# Patient Record
Sex: Female | Born: 2000 | Race: Black or African American | Hispanic: No | Marital: Single | Smoking: Former smoker
Health system: Southern US, Community
[De-identification: ages and names within clinical notes are randomized; demographics above are authoritative.]

## PROBLEM LIST (undated history)

## (undated) ENCOUNTER — Inpatient Hospital Stay (HOSPITAL_COMMUNITY): Payer: Self-pay

## (undated) HISTORY — PX: NO PAST SURGERIES: SHX2092

---
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
## 2021-03-18 ENCOUNTER — Telehealth: Payer: Medicaid Other | Admitting: Physician Assistant

## 2021-06-17 ENCOUNTER — Ambulatory Visit
Admission: EM | Admit: 2021-06-17 | Discharge: 2021-06-17 | Disposition: A | Discharge status: Home / Self Care | Payer: Medicaid Other | Attending: Physician Assistant | Admitting: Physician Assistant

## 2021-06-17 ENCOUNTER — Other Ambulatory Visit: Payer: Self-pay

## 2021-06-17 ENCOUNTER — Encounter: Payer: Self-pay | Admitting: Emergency Medicine

## 2021-06-17 DIAGNOSIS — Z113 Encounter for screening for infections with a predominantly sexual mode of transmission: Secondary | ICD-10-CM | POA: Diagnosis present
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
## 2021-06-17 NOTE — ED Triage Notes (Signed)
Patient c/o vaginal odorous white discharge x 1 month.  Patient did have unprotected sex with her ex after being treated for chlamydia.  Patient feels that her ex did not get treated and she became reinfected.

## 2021-06-18 LAB — HIV ANTIBODY (ROUTINE TESTING W REFLEX): HIV Screen 4th Generation wRfx: NONREACTIVE

## 2021-06-18 LAB — RPR: RPR Ser Ql: NONREACTIVE

## 2021-06-19 ENCOUNTER — Telehealth (HOSPITAL_COMMUNITY): Payer: Self-pay | Admitting: Emergency Medicine

## 2021-06-19 LAB — CERVICOVAGINAL ANCILLARY ONLY
Bacterial Vaginitis (gardnerella): POSITIVE — AB
Candida Glabrata: NEGATIVE
Candida Vaginitis: NEGATIVE
Chlamydia: NEGATIVE
Comment: NEGATIVE
Comment: NEGATIVE
Comment: NEGATIVE
Comment: NEGATIVE
Comment: NEGATIVE
Comment: NORMAL
Neisseria Gonorrhea: NEGATIVE
Trichomonas: NEGATIVE

## 2021-06-19 MED ORDER — METRONIDAZOLE 500 MG PO TABS: 500.0000 mg | ORAL_TABLET | Freq: Two times a day (BID) | ORAL | 0 refills | Status: DC

## 2021-07-17 ENCOUNTER — Other Ambulatory Visit: Payer: Self-pay

## 2021-07-17 ENCOUNTER — Ambulatory Visit (HOSPITAL_COMMUNITY)
Admission: EM | Admit: 2021-07-17 | Discharge: 2021-07-17 | Disposition: A | Discharge status: Home / Self Care | Payer: Medicaid Other | Attending: Nurse Practitioner | Admitting: Nurse Practitioner

## 2021-07-17 ENCOUNTER — Encounter (HOSPITAL_COMMUNITY): Payer: Self-pay | Admitting: Emergency Medicine

## 2021-07-17 DIAGNOSIS — R059 Cough, unspecified: Secondary | ICD-10-CM | POA: Insufficient documentation

## 2021-07-17 DIAGNOSIS — Z20822 Contact with and (suspected) exposure to covid-19: Secondary | ICD-10-CM | POA: Insufficient documentation

## 2021-07-17 DIAGNOSIS — B9689 Other specified bacterial agents as the cause of diseases classified elsewhere: Secondary | ICD-10-CM

## 2021-07-17 DIAGNOSIS — J019 Acute sinusitis, unspecified: Secondary | ICD-10-CM | POA: Insufficient documentation

## 2021-07-17 DIAGNOSIS — H9209 Otalgia, unspecified ear: Secondary | ICD-10-CM | POA: Diagnosis not present

## 2021-07-17 DIAGNOSIS — J111 Influenza due to unidentified influenza virus with other respiratory manifestations: Secondary | ICD-10-CM

## 2021-07-17 LAB — POC INFLUENZA A AND B ANTIGEN (URGENT CARE ONLY)
INFLUENZA A ANTIGEN, POC: NEGATIVE
INFLUENZA B ANTIGEN, POC: NEGATIVE

## 2021-07-17 MED ORDER — AMOXICILLIN-POT CLAVULANATE 875-125 MG PO TABS
1.0000 | ORAL_TABLET | Freq: Two times a day (BID) | ORAL | 0 refills | Status: AC
Start: 2021-07-17 — End: 2021-07-24

## 2021-07-17 NOTE — ED Triage Notes (Signed)
Pt reports sore throat, nasal congestion, chills, body aches, bilateral ear pain and cough x 5 days.  ?

## 2021-07-17 NOTE — Discharge Instructions (Addendum)
-   Flu test today is negative ?- We will notify you if COVID test is positive ?- Please start the Augmentin twice daily for 7 days for the sinus infection.  ?Some things that can make you feel better are: ?- Increased rest ?- Increasing fluid with water/sugar free electrolytes ?- Acetaminophen as needed for fever/pain.  ?- Salt water gargling, chloraseptic spray and throat lozenges ?- OTC guaifenesin (Mucinex).  ?- Saline sinus flushes or a neti pot.  ?- Humidifying the air. ? ?
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
## 2021-07-18 LAB — SARS CORONAVIRUS 2 (TAT 6-24 HRS): SARS Coronavirus 2: NEGATIVE

## 2021-07-28 ENCOUNTER — Ambulatory Visit (INDEPENDENT_AMBULATORY_CARE_PROVIDER_SITE_OTHER): Payer: Medicaid Other | Admitting: Obstetrics and Gynecology

## 2021-07-28 ENCOUNTER — Ambulatory Visit (INDEPENDENT_AMBULATORY_CARE_PROVIDER_SITE_OTHER): Payer: Medicaid Other | Admitting: Licensed Clinical Social Worker

## 2021-07-28 ENCOUNTER — Other Ambulatory Visit (HOSPITAL_COMMUNITY)
Admission: RE | Admit: 2021-07-28 | Discharge: 2021-07-28 | Disposition: A | Discharge status: Home / Self Care | Payer: Medicaid Other | Source: Ambulatory Visit | Attending: Obstetrics and Gynecology | Admitting: Obstetrics and Gynecology

## 2021-07-28 ENCOUNTER — Encounter: Payer: Self-pay | Admitting: Obstetrics and Gynecology

## 2021-07-28 ENCOUNTER — Other Ambulatory Visit: Payer: Self-pay

## 2021-07-28 VITALS — BP 129/83 | HR 98 | Ht 61.0 in | Wt 104.0 lb

## 2021-07-28 DIAGNOSIS — F32A Depression, unspecified: Secondary | ICD-10-CM

## 2021-07-28 DIAGNOSIS — Z01419 Encounter for gynecological examination (general) (routine) without abnormal findings: Secondary | ICD-10-CM | POA: Insufficient documentation

## 2021-07-28 DIAGNOSIS — F330 Major depressive disorder, recurrent, mild: Secondary | ICD-10-CM | POA: Diagnosis not present

## 2021-07-28 DIAGNOSIS — Z30013 Encounter for initial prescription of injectable contraceptive: Secondary | ICD-10-CM

## 2021-07-28 LAB — POCT URINE PREGNANCY: Preg Test, Ur: NEGATIVE

## 2021-07-28 MED ORDER — MEDROXYPROGESTERONE ACETATE 150 MG/ML IM SUSP: 150.0000 mg | INTRAMUSCULAR | 5 refills | Status: DC

## 2021-07-28 MED ORDER — MEDROXYPROGESTERONE ACETATE 150 MG/ML IM SUSP
150.0000 mg | Freq: Once | INTRAMUSCULAR | Status: AC
  Administered 2021-07-28: 150 mg via INTRAMUSCULAR

## 2021-07-28 NOTE — Progress Notes (Signed)
Administrations This Visit   ? ? medroxyPROGESTERone (DEPO-PROVERA) injection 150 mg   ? ? Admin Date ?07/28/2021 Action ?Given Dose ?150 mg Route ?Intramuscular Administered By ?Marya Landry D, CMA  ? ?  ?  ? ?  ?  ?
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
## 2021-07-29 ENCOUNTER — Encounter: Payer: Self-pay | Admitting: *Deleted

## 2021-07-29 LAB — CERVICOVAGINAL ANCILLARY ONLY
Chlamydia: POSITIVE — AB
Comment: NEGATIVE
Comment: NORMAL
Neisseria Gonorrhea: NEGATIVE

## 2021-07-29 MED ORDER — DOXYCYCLINE HYCLATE 100 MG PO CAPS: 100.0000 mg | ORAL_CAPSULE | Freq: Two times a day (BID) | ORAL | 0 refills | Status: DC

## 2021-07-29 NOTE — Progress Notes (Signed)
TC to pt to inform of +CT, RX sent. Advised need for partner to be treated and counseled on abstinence until 1 week after everyone is treated. Advised of recommendation for testing for other STIs and f/u for TOC in 4 weeks. Pt verbalized understanding. Call transferred to schedulers per pt request. MyChart message with education on CT sent with pt's permission.

## 2021-07-29 NOTE — Addendum Note (Signed)
Addended by: Catalina Antigua on: 07/29/2021 02:51 PM ? ? Modules accepted: Orders ? ?

## 2021-07-30 NOTE — BH Specialist Note (Signed)
Integrated Behavioral Health Initial In-Person Visit ? ?MRN: [id number] ?Name: Morgan Haas ? ?Number of Integrated Behavioral Health Clinician visits: 1 ?Session Start time:   2:03pm ?Session End time: 2:48pm ?Total time in minutes: 45 mins in person at [facility]  ? ?Types of Service: Individual psychotherapy ? ?Interpretor:No. Interpretor Name and Language: none ? ? Warm Hand Off Completed. ?  ? ?  ? ? ?Subjective: ?Morgan Haas is a 21 y.o. female accompanied by n/a ?Patient was referred by Dr.Constant for depression . ?Patient reports the following symptoms/concerns: depressed mood, grieving loss of parent, feeling overwhelmed ?Duration of problem: Jan 2022; Severity of problem: mild ? ?Objective: ?Mood: good and Affect: Appropriate ?Risk of harm to self or others: No plan to harm self or others ? ?Life Context: ?Family and Social: Live with family ?School/Work: part time  ?Self-Care: n/a ?Life Changes: Mother passed away ? ?Patient and/or Family's Strengths/Protective Factors: ?Sense of purpose ? ?Goals Addressed: ?Patient will: ?Reduce symptoms of: stress and depression ?Increase knowledge and/or ability of: coping skills and stress reduction  ?Demonstrate ability to: Increase healthy adjustment to current life circumstances and Begin healthy grieving over loss ? ?Progress towards Goals: ?Ongoing ? ?Interventions: ?Interventions utilized: Supportive Counseling and Link to [facility]  ?Standardized Assessments completed: PHQ 9 ? ?Patient and/or Family Response: Morgan Haas responded well to visit.  ? ?Assessment: ?Patient currently experiencing depression episode and grief associate with death of parent. ?  ?Patient may benefit from outpatient therapy. ? ?Plan: ?Follow up with behavioral health clinician on : 08/26/2021 ?Behavioral recommendations: Engage in grief counseling with [facility], engage in therapy, begin journal writing, communicate need with family for added support  ?Referral(s): Aeronautical engineer (LME/Outside Clinic) ?"From scale of 1-10, how likely are you to follow plan?":  ? ?Gwyndolyn Saxon, LCSW ? ? ? ? ? ? ? ? ?

## 2021-08-04 ENCOUNTER — Encounter: Payer: Self-pay | Admitting: *Deleted

## 2021-08-04 ENCOUNTER — Other Ambulatory Visit: Payer: Self-pay | Admitting: Obstetrics and Gynecology

## 2021-08-04 MED ORDER — AZITHROMYCIN 500 MG PO TABS: 1000.0000 mg | ORAL_TABLET | Freq: Once | ORAL | 1 refills | Status: AC

## 2021-08-13 ENCOUNTER — Encounter: Payer: Self-pay | Admitting: Obstetrics and Gynecology

## 2021-08-26 ENCOUNTER — Ambulatory Visit (INDEPENDENT_AMBULATORY_CARE_PROVIDER_SITE_OTHER): Payer: Medicaid Other

## 2021-08-26 ENCOUNTER — Other Ambulatory Visit (HOSPITAL_COMMUNITY)
Admission: RE | Admit: 2021-08-26 | Discharge: 2021-08-26 | Disposition: A | Discharge status: Home / Self Care | Payer: Medicaid Other | Source: Ambulatory Visit | Attending: Obstetrics | Admitting: Obstetrics

## 2021-08-26 ENCOUNTER — Ambulatory Visit (INDEPENDENT_AMBULATORY_CARE_PROVIDER_SITE_OTHER): Payer: Medicaid Other | Admitting: Licensed Clinical Social Worker

## 2021-08-26 DIAGNOSIS — F330 Major depressive disorder, recurrent, mild: Secondary | ICD-10-CM

## 2021-08-26 DIAGNOSIS — Z8619 Personal history of other infectious and parasitic diseases: Secondary | ICD-10-CM | POA: Diagnosis present

## 2021-08-26 NOTE — Progress Notes (Signed)
Patient presents for a TOC for positive Chlamydia on 3/27. Patient denies having any vaginal discharge, odor, or irritation at this time. Patient states that she did completed her treatment and that she has abstained from intercourse since being treated. She does state that she did vomit the first couple of days of the doxycycline, but did complete full prescription. She was then retreated with azithromycin after taking the doxycycline which she states that she tolerated. Also states that she took the refill of the azithromycin that was provided. Patient states that her partner did receive treatment. Patient would like to be rechecked for all STDs including yeast and bv today. Self swab collected. Advised patient that results may take 24-48 hours to return. ?

## 2021-08-27 LAB — CERVICOVAGINAL ANCILLARY ONLY
Bacterial Vaginitis (gardnerella): NEGATIVE
Candida Glabrata: NEGATIVE
Candida Vaginitis: POSITIVE — AB
Chlamydia: NEGATIVE
Comment: NEGATIVE
Comment: NEGATIVE
Comment: NEGATIVE
Comment: NEGATIVE
Comment: NEGATIVE
Comment: NORMAL
Neisseria Gonorrhea: NEGATIVE
Trichomonas: NEGATIVE

## 2021-08-27 NOTE — BH Specialist Note (Signed)
Integrated Behavioral Health Follow Up In-Person Visit ? ?MRN: [id number] ?Name: Morgan Haas ? ?Number of Integrated Behavioral Health Clinician visits: 2- Second Visit ? ?Session Start time: 1041 ?  ?Session End time: 1135 ? ?Total time in minutes: 54 ?In person at [facility]  ? ?Types of Service: Individual psychotherapy ? ?Interpretor:No. Interpretor Name and Language: none  ? ?Subjective: ?Morgan Haas is a 21 y.o. female accompanied by  ?Patient was referred by Dr. Jolayne Panther  for depression. ?Patient reports the following symptoms/concerns: depressed mood, grieving loss of parent  ?Duration of problem: Jan 2022; Severity of problem: mild ? ?Objective: ?Mood: good  and Affect: Appropriate ?Risk of harm to self or others: n/an/aNo plan to harm self or others ? ?Life Context: ?Family and Social: Lives with Morgan Haas in [place]  ?School/Work: [employer] ?Self-Care: n/a ?Life Changes: Mom passed away ? ?Patient and/or Family's Strengths/Protective Factors: ?Concrete supports in place (healthy food, safe environments, etc.) ? ?Goals Addressed: ?Patient will: ? Reduce symptoms of: depression  ? Increase knowledge and/or ability of: coping skills  ? Demonstrate ability to: Begin healthy grieving over loss ? ?Progress towards Goals: ?Other ? ?Interventions: ?Interventions utilized:  Supportive Counseling ?Standardized Assessments completed: Not Needed ? ?Patient and/or Family Response: Ms. Haldeman responded well to visit  ? ?Assessment: ?Patient currently experiencing depression.  ? ?Patient may benefit from community mental health. ? ?Plan: ?Follow up with behavioral health clinician on : prn ?Behavioral recommendations: contact community mental health, grief counseling with [facility], communicate needs with family for addd support  ?Referral(s): Paramedic (LME/Outside Clinic) ?"From scale of 1-10, how likely are you to follow plan?":   ? ?Morgan Saxon, LCSW ? ? ?

## 2021-08-28 ENCOUNTER — Other Ambulatory Visit: Payer: Self-pay | Admitting: Obstetrics and Gynecology

## 2021-08-28 MED ORDER — FLUCONAZOLE 150 MG PO TABS: 150.0000 mg | ORAL_TABLET | Freq: Once | ORAL | 0 refills | Status: AC

## 2021-09-16 ENCOUNTER — Encounter: Payer: Self-pay | Admitting: Obstetrics and Gynecology

## 2021-10-20 ENCOUNTER — Ambulatory Visit: Payer: Medicaid Other

## 2021-10-21 ENCOUNTER — Ambulatory Visit: Payer: Medicaid Other

## 2021-10-28 ENCOUNTER — Ambulatory Visit: Payer: Medicaid Other

## 2021-10-30 ENCOUNTER — Ambulatory Visit (INDEPENDENT_AMBULATORY_CARE_PROVIDER_SITE_OTHER): Payer: Medicaid Other

## 2021-10-30 VITALS — BP 109/68 | HR 68 | Wt 101.0 lb

## 2021-10-30 DIAGNOSIS — Z3042 Encounter for surveillance of injectable contraceptive: Secondary | ICD-10-CM

## 2021-10-30 MED ORDER — MEDROXYPROGESTERONE ACETATE 150 MG/ML IM SUSP
150.0000 mg | Freq: Once | INTRAMUSCULAR | Status: AC
  Administered 2021-10-30: 150 mg via INTRAMUSCULAR

## 2021-10-30 NOTE — Progress Notes (Signed)
SUBJECTIVE: Morgan Haas is a 21 y.o. female who presents for DEPO Injection.   OBJECTIVE: Appears well, in no apparent distress.  Vital signs are normal..    ASSESSMENT: Need for BC.   PLAN: DEPO Injection given in [facility], tolerated well. Next DEPO due Sept. 14-28, 2023   Administrations This Visit     medroxyPROGESTERone (DEPO-PROVERA) injection 150 mg     Admin Date 10/30/2021 Action Given Dose 150 mg Route Intramuscular Administered By Maretta Bees, RMA

## 2021-10-30 NOTE — Progress Notes (Signed)
Agree with nurses's documentation of this patient's clinic encounter.  Celes Dedic L, MD  

## 2022-01-21 ENCOUNTER — Ambulatory Visit: Payer: Medicaid Other

## 2022-01-21 NOTE — Progress Notes (Deleted)
Date last pap: ***.  Last Depo-Provera: ***.  Side Effects if any: ***.  Serum HCG indicated? ***.  Depo-Provera 150 mg IM given by: ***.  Next appointment due ***.

## 2022-02-26 ENCOUNTER — Ambulatory Visit
Admission: RE | Admit: 2022-02-26 | Discharge: 2022-02-26 | Disposition: A | Discharge status: Home / Self Care | Payer: Medicaid Other | Source: Ambulatory Visit | Attending: Internal Medicine | Admitting: Internal Medicine

## 2022-02-26 ENCOUNTER — Ambulatory Visit (INDEPENDENT_AMBULATORY_CARE_PROVIDER_SITE_OTHER): Payer: Medicaid Other

## 2022-02-26 VITALS — BP 108/71 | HR 62 | Temp 97.9°F | Resp 18

## 2022-02-26 DIAGNOSIS — R0602 Shortness of breath: Secondary | ICD-10-CM | POA: Insufficient documentation

## 2022-02-26 DIAGNOSIS — Z113 Encounter for screening for infections with a predominantly sexual mode of transmission: Secondary | ICD-10-CM | POA: Diagnosis present

## 2022-02-26 LAB — POCT URINE PREGNANCY: Preg Test, Ur: NEGATIVE
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
## 2022-02-26 NOTE — Discharge Instructions (Addendum)
Chest x-ray was normal.  Recommend that you stop smoking cigarettes and vaping to see if symptoms go away.  Also recommend that you follow-up with your primary care doctor and pulmonologist at provided contact information for further evaluation and management.  Please go to the emergency department if symptoms persist or worsen.  Your STD testing is pending.  We will call if it is abnormal and treat as appropriate.  Please refrain from sexual activity until test results and treatment are complete.
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
## 2022-02-27 ENCOUNTER — Telehealth (HOSPITAL_COMMUNITY): Payer: Self-pay | Admitting: Emergency Medicine

## 2022-02-27 LAB — CERVICOVAGINAL ANCILLARY ONLY
Bacterial Vaginitis (gardnerella): POSITIVE — AB
Candida Glabrata: NEGATIVE
Candida Vaginitis: NEGATIVE
Chlamydia: NEGATIVE
Comment: NEGATIVE
Comment: NEGATIVE
Comment: NEGATIVE
Comment: NEGATIVE
Comment: NEGATIVE
Comment: NORMAL
Neisseria Gonorrhea: NEGATIVE
Trichomonas: NEGATIVE

## 2022-02-27 LAB — HIV ANTIBODY (ROUTINE TESTING W REFLEX): HIV Screen 4th Generation wRfx: NONREACTIVE

## 2022-02-27 LAB — RPR: RPR Ser Ql: NONREACTIVE

## 2022-02-27 MED ORDER — METRONIDAZOLE 500 MG PO TABS: 500.0000 mg | ORAL_TABLET | Freq: Two times a day (BID) | ORAL | 0 refills | Status: DC
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
## 2022-08-04 ENCOUNTER — Ambulatory Visit: Payer: Medicaid Other | Admitting: Obstetrics

## 2022-10-24 ENCOUNTER — Ambulatory Visit (HOSPITAL_COMMUNITY)
Admission: RE | Admit: 2022-10-24 | Discharge: 2022-10-24 | Disposition: A | Discharge status: Home / Self Care | Payer: Medicaid Other | Source: Ambulatory Visit | Attending: Emergency Medicine | Admitting: Emergency Medicine

## 2022-10-24 ENCOUNTER — Encounter (HOSPITAL_COMMUNITY): Payer: Self-pay

## 2022-10-24 ENCOUNTER — Other Ambulatory Visit: Payer: Self-pay

## 2022-10-24 VITALS — BP 121/87 | HR 94 | Temp 98.6°F | Resp 18

## 2022-10-24 DIAGNOSIS — J069 Acute upper respiratory infection, unspecified: Secondary | ICD-10-CM | POA: Diagnosis not present

## 2022-10-24 MED ORDER — GUAIFENESIN ER 600 MG PO TB12: 1200.0000 mg | ORAL_TABLET | Freq: Two times a day (BID) | ORAL | 0 refills | Status: AC

## 2022-10-24 MED ORDER — CETIRIZINE HCL 10 MG PO TABS: 10.0000 mg | ORAL_TABLET | Freq: Every day | ORAL | 0 refills | Status: DC | PRN

## 2022-10-24 MED ORDER — FLUTICASONE PROPIONATE 50 MCG/ACT NA SUSP: 1.0000 | Freq: Every day | NASAL | 2 refills | Status: DC
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
## 2022-10-24 NOTE — ED Triage Notes (Signed)
Pt reports being congested with runny nose .

## 2022-10-24 NOTE — Discharge Instructions (Addendum)
Your symptoms appear viral in nature.  Please take your daily antihistamines, cetirizine.  Start the [employer] and Mucinex.  Ensure you are drinking at least 64 ounces of water daily.  Please also do the nasal rinses twice daily with filtered water.  You can sleep with a humidifier, drink tea with honey, and do warm saline gargles for your sore throat.  Please return to clinic in 5 to 7 days, if your symptoms have not improved for reevaluation.  You can also follow-up with your primary care provider.

## 2022-12-09 ENCOUNTER — Ambulatory Visit: Payer: Medicaid Other | Admitting: Orthopaedic Surgery

## 2022-12-10 ENCOUNTER — Encounter: Payer: Self-pay | Admitting: Physician Assistant

## 2022-12-10 ENCOUNTER — Ambulatory Visit
Admission: EM | Admit: 2022-12-10 | Discharge: 2022-12-10 | Disposition: A | Discharge status: Home / Self Care | Payer: Medicaid Other | Attending: Physician Assistant | Admitting: Physician Assistant

## 2022-12-10 ENCOUNTER — Ambulatory Visit: Payer: Medicaid Other

## 2022-12-10 DIAGNOSIS — Z1152 Encounter for screening for COVID-19: Secondary | ICD-10-CM | POA: Insufficient documentation

## 2022-12-10 DIAGNOSIS — J069 Acute upper respiratory infection, unspecified: Secondary | ICD-10-CM | POA: Diagnosis not present

## 2022-12-10 LAB — POCT RAPID STREP A (OFFICE): Rapid Strep A Screen: NEGATIVE
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
## 2022-12-10 NOTE — ED Triage Notes (Signed)
Pt reports that she had sore throat, body aches, chills for about 4 days. Hasn't taken any medications for symptoms. Pt reports has swollen lymph node on right side of neck.

## 2022-12-25 ENCOUNTER — Encounter: Payer: Medicaid Other | Admitting: Family

## 2022-12-25 NOTE — Progress Notes (Signed)
Erroneous encounter-disregard

## 2023-02-22 ENCOUNTER — Other Ambulatory Visit (HOSPITAL_COMMUNITY)
Admission: RE | Admit: 2023-02-22 | Discharge: 2023-02-22 | Disposition: A | Discharge status: Home / Self Care | Payer: Medicaid Other | Source: Ambulatory Visit | Attending: Obstetrics and Gynecology | Admitting: Obstetrics and Gynecology

## 2023-02-22 ENCOUNTER — Encounter: Payer: Self-pay | Admitting: Obstetrics and Gynecology

## 2023-02-22 ENCOUNTER — Ambulatory Visit (INDEPENDENT_AMBULATORY_CARE_PROVIDER_SITE_OTHER): Payer: Medicaid Other | Admitting: Obstetrics and Gynecology

## 2023-02-22 VITALS — BP 95/57 | HR 68 | Ht 61.0 in | Wt 102.0 lb

## 2023-02-22 DIAGNOSIS — Z01419 Encounter for gynecological examination (general) (routine) without abnormal findings: Secondary | ICD-10-CM | POA: Insufficient documentation

## 2023-02-22 DIAGNOSIS — Z1339 Encounter for screening examination for other mental health and behavioral disorders: Secondary | ICD-10-CM | POA: Diagnosis not present

## 2023-02-22 DIAGNOSIS — Z3202 Encounter for pregnancy test, result negative: Secondary | ICD-10-CM | POA: Diagnosis not present

## 2023-02-22 LAB — POCT URINE PREGNANCY: Preg Test, Ur: NEGATIVE

## 2023-02-22 MED ORDER — NORETHIN ACE-ETH ESTRAD-FE 1-20 MG-MCG(24) PO TABS
1.0000 | ORAL_TABLET | Freq: Every day | ORAL | 11 refills | Status: DC
Start: 2023-02-22 — End: 2023-09-20

## 2023-02-22 MED ORDER — IMIQUIMOD 5 % EX CREA: TOPICAL_CREAM | CUTANEOUS | 3 refills | Status: DC
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
## 2023-02-23 LAB — HIV ANTIBODY (ROUTINE TESTING W REFLEX): HIV Screen 4th Generation wRfx: NONREACTIVE

## 2023-02-23 LAB — HEPATITIS B SURFACE ANTIGEN: Hepatitis B Surface Ag: NEGATIVE

## 2023-02-23 LAB — HEPATITIS C ANTIBODY: Hep C Virus Ab: NONREACTIVE

## 2023-02-23 LAB — RPR: RPR Ser Ql: NONREACTIVE

## 2023-02-24 LAB — CERVICOVAGINAL ANCILLARY ONLY
Chlamydia: NEGATIVE
Comment: NEGATIVE
Comment: NORMAL
Neisseria Gonorrhea: NEGATIVE

## 2023-03-01 LAB — CYTOLOGY - PAP
Comment: NEGATIVE
High risk HPV: POSITIVE — AB

## 2023-03-24 ENCOUNTER — Ambulatory Visit: Payer: Medicaid Other

## 2023-03-25 ENCOUNTER — Ambulatory Visit: Payer: Medicaid Other

## 2023-04-06 ENCOUNTER — Ambulatory Visit: Payer: Medicaid Other

## 2023-04-26 ENCOUNTER — Ambulatory Visit: Payer: Medicaid Other | Admitting: Dermatology

## 2023-07-08 ENCOUNTER — Ambulatory Visit

## 2023-07-08 VITALS — BP 115/64 | HR 67

## 2023-07-08 DIAGNOSIS — Z3201 Encounter for pregnancy test, result positive: Secondary | ICD-10-CM | POA: Diagnosis not present

## 2023-07-08 DIAGNOSIS — Z3200 Encounter for pregnancy test, result unknown: Secondary | ICD-10-CM

## 2023-07-08 LAB — POCT URINE PREGNANCY: Preg Test, Ur: POSITIVE — AB

## 2023-07-08 MED ORDER — VITAFOL GUMMIES 3.33-0.333-34.8 MG PO CHEW: 1.0000 | CHEWABLE_TABLET | Freq: Every day | ORAL | 5 refills | Status: DC

## 2023-07-08 MED ORDER — PROMETHAZINE HCL 25 MG PO TABS: 25.0000 mg | ORAL_TABLET | Freq: Four times a day (QID) | ORAL | 1 refills | Status: DC | PRN

## 2023-07-08 NOTE — Progress Notes (Signed)
 Morgan Haas here for a UPT. Pt had a positive upt at home. LMP is 05/12/23.     UPT in office Positive.    Reviewed medications and informed to start a PNV, if not already. Pt to follow up in 1 week for New OB visit.

## 2023-07-29 ENCOUNTER — Ambulatory Visit: Admitting: *Deleted

## 2023-07-29 ENCOUNTER — Other Ambulatory Visit (INDEPENDENT_AMBULATORY_CARE_PROVIDER_SITE_OTHER): Payer: Self-pay

## 2023-07-29 ENCOUNTER — Other Ambulatory Visit (HOSPITAL_COMMUNITY)
Admission: RE | Admit: 2023-07-29 | Discharge: 2023-07-29 | Disposition: A | Discharge status: Home / Self Care | Source: Ambulatory Visit | Attending: Obstetrics and Gynecology | Admitting: Obstetrics and Gynecology

## 2023-07-29 VITALS — BP 110/68 | HR 63 | Wt 101.6 lb

## 2023-07-29 DIAGNOSIS — Z3480 Encounter for supervision of other normal pregnancy, unspecified trimester: Secondary | ICD-10-CM

## 2023-07-29 DIAGNOSIS — Z3401 Encounter for supervision of normal first pregnancy, first trimester: Secondary | ICD-10-CM

## 2023-07-29 DIAGNOSIS — Z3A11 11 weeks gestation of pregnancy: Secondary | ICD-10-CM | POA: Diagnosis not present

## 2023-07-29 DIAGNOSIS — Z1339 Encounter for screening examination for other mental health and behavioral disorders: Secondary | ICD-10-CM

## 2023-07-29 DIAGNOSIS — J302 Other seasonal allergic rhinitis: Secondary | ICD-10-CM | POA: Diagnosis not present

## 2023-07-29 DIAGNOSIS — O3680X0 Pregnancy with inconclusive fetal viability, not applicable or unspecified: Secondary | ICD-10-CM

## 2023-07-29 MED ORDER — CETIRIZINE HCL 10 MG PO TABS: 10.0000 mg | ORAL_TABLET | Freq: Every day | ORAL | 0 refills | Status: AC | PRN

## 2023-07-29 MED ORDER — COMPLETENATE 29-1 MG PO CHEW: 1.0000 | CHEWABLE_TABLET | Freq: Every day | ORAL | 11 refills | Status: DC
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
## 2023-07-30 NOTE — Patient Instructions (Addendum)
 The [facility] [employer] has a partnership with the [facility] [facility] to provide prenatal navigation for the most needed resources in our community. In order to see how we can help connect you to these resources we need consent to contact you. Please complete the very short consent using the link below:   English Link: [email or web address].[email or web address]?[email or web address]  Spanish Link: [email or web address].[email or web address]?[email or web address]  Our practice his participating in a study that provides no-cost doula care. ACURE4Moms is a study looking at how doula care can reduce birthing disparities for Black and brown birthing people. We like to refer patients as soon as possible, but definitely before 28 weeks so patients can get to know their doula.    A doula is trained to provide support before, during and just after you give birth. While doulas do not provide medical care, they do provide emotional, physical and educational support. Doulas can help reduce your stress and comfort you and your partner. They can help you cope with labor by helping you use breathing techniques, massage, creative labor positioning, essential oils and affirmations.   ACURE4Moms is a research study trying to reduce:   low birthweight babies  emergency department visits & hospitalizations for birthing persons and their babies  depression among birthing people  discrimination in pregnancy-related care ACURE4Moms is trying out 2 programs designed by  people who have given birth. These programs include: 1. Sharing patient data and warning alerts with clinic staff to keep them accountable for their patients' outcomes and providing tools to help them  reduce bias in care. 2. Matching eligible patients with doulas from the  same community as the patients.  If you would like to participate in this study, please visit:   http://carroll-castaneda.info/  Options for Doula Care in the [place]  As you review  your birthing options, consider having a birth doula. A doula is trained to provide support before, during and just after you give birth. There are also postpartum doulas that help you adjust to new parenthood.  While doulas do not provide medical care, they do provide emotional, physical and educational support. A few months before your baby arrives, doulas can help answer questions, ease concerns and help you create and support your birthing plan.    Doulas can help reduce your stress and comfort you and your partner. They can help you cope with labor by helping you use breathing techniques, massage, creative labor positioning, essential oils and affirmations.   Studies show that the benefits of having a doula include:   A more positive birth experience  Fewer requests for pain-relief medication  Less likelihood of cesarean section, commonly called a c-section   Doulas are typically hired via a Advertising account planner between you and the doula. We are happy to provide a list of the most active doulas in the area, all of whom are credentialed by [facility] and will not count as a visitor at your birth.  There are several options for no-cost doula care at our hospital, including:  [facility] Volunteer Doula Program Every [employer] Program A Cure 4 Moms Doula Study (available only at [employer] [facility], [place], [place] and [facility] offices)  For more information on these programs or to receive a list of doulas active in our area, please email [email or web address]   [facility] [place] Phone [phone]  Address [address] [place], [place] [address]  Hours Open 24/7. No appointment required.   [facility] Office [phone]  [place], [place] [address] Phone [phone]   [facility] [employer]: go to website to see calendar  [employer]: located inside the Therapist, music [facility] [employer]. You can go after your first visit with the doctor.

## 2023-08-01 LAB — CULTURE, OB URINE

## 2023-08-01 LAB — URINE CULTURE, OB REFLEX: Organism ID, Bacteria: NO GROWTH

## 2023-08-02 ENCOUNTER — Other Ambulatory Visit: Payer: Self-pay

## 2023-08-02 ENCOUNTER — Other Ambulatory Visit

## 2023-08-02 LAB — CERVICOVAGINAL ANCILLARY ONLY
Bacterial Vaginitis (gardnerella): POSITIVE — AB
Candida Glabrata: NEGATIVE
Candida Vaginitis: NEGATIVE
Chlamydia: NEGATIVE
Comment: NEGATIVE
Comment: NEGATIVE
Comment: NEGATIVE
Comment: NEGATIVE
Comment: NEGATIVE
Comment: NORMAL
Neisseria Gonorrhea: NEGATIVE
Trichomonas: NEGATIVE

## 2023-08-02 MED ORDER — METRONIDAZOLE 500 MG PO TABS: 500.0000 mg | ORAL_TABLET | Freq: Two times a day (BID) | ORAL | 0 refills | Status: DC

## 2023-08-02 NOTE — Progress Notes (Signed)
Flagyl rx sent for BV

## 2023-08-17 ENCOUNTER — Encounter: Admitting: Advanced Practice Midwife

## 2023-08-19 ENCOUNTER — Telehealth: Payer: Self-pay | Admitting: Clinical

## 2023-08-19 NOTE — Telephone Encounter (Signed)
 Attempt call regarding referral; Unable to leave message.

## 2023-09-17 ENCOUNTER — Ambulatory Visit

## 2023-09-20 ENCOUNTER — Inpatient Hospital Stay (HOSPITAL_COMMUNITY)
Admission: EM | Admit: 2023-09-20 | Discharge: 2023-09-20 | Disposition: A | Discharge status: Home / Self Care | Attending: Obstetrics and Gynecology | Admitting: Obstetrics and Gynecology

## 2023-09-20 ENCOUNTER — Encounter (HOSPITAL_COMMUNITY): Payer: Self-pay | Admitting: Emergency Medicine

## 2023-09-20 ENCOUNTER — Other Ambulatory Visit: Payer: Self-pay

## 2023-09-20 DIAGNOSIS — Z3A18 18 weeks gestation of pregnancy: Secondary | ICD-10-CM | POA: Diagnosis not present

## 2023-09-20 DIAGNOSIS — O26892 Other specified pregnancy related conditions, second trimester: Secondary | ICD-10-CM | POA: Diagnosis present

## 2023-09-20 DIAGNOSIS — M545 Low back pain, unspecified: Secondary | ICD-10-CM | POA: Insufficient documentation

## 2023-09-20 DIAGNOSIS — M62830 Muscle spasm of back: Secondary | ICD-10-CM | POA: Insufficient documentation

## 2023-09-20 DIAGNOSIS — M791 Myalgia, unspecified site: Secondary | ICD-10-CM
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
## 2023-09-21 LAB — ABO/RH: ABO/RH(D): A POS

## 2023-09-22 ENCOUNTER — Other Ambulatory Visit: Payer: Self-pay | Admitting: Obstetrics and Gynecology

## 2023-09-22 ENCOUNTER — Other Ambulatory Visit

## 2023-09-24 ENCOUNTER — Telehealth: Payer: Self-pay

## 2023-09-24 ENCOUNTER — Other Ambulatory Visit

## 2023-09-28 ENCOUNTER — Other Ambulatory Visit: Payer: Self-pay

## 2023-09-28 DIAGNOSIS — Z01419 Encounter for gynecological examination (general) (routine) without abnormal findings: Secondary | ICD-10-CM

## 2023-09-28 NOTE — Telephone Encounter (Signed)
 Call returned to patient. RN advised that aldara  cream is not recommended to use during pregnancy. Pt was not pregnant at the time that medication was initially prescribed. Pt states that she has been using cream. RN advised that a message would be sent to see if there are any other alternatives for her.

## 2023-10-04 ENCOUNTER — Encounter: Payer: Self-pay | Admitting: Advanced Practice Midwife

## 2023-10-04 ENCOUNTER — Ambulatory Visit: Admitting: Advanced Practice Midwife

## 2023-10-04 VITALS — BP 112/55 | HR 91 | Wt 111.4 lb

## 2023-10-04 DIAGNOSIS — Z3482 Encounter for supervision of other normal pregnancy, second trimester: Secondary | ICD-10-CM

## 2023-10-04 DIAGNOSIS — O98312 Other infections with a predominantly sexual mode of transmission complicating pregnancy, second trimester: Secondary | ICD-10-CM

## 2023-10-04 DIAGNOSIS — A630 Anogenital (venereal) warts: Secondary | ICD-10-CM

## 2023-10-04 DIAGNOSIS — Z31430 Encounter of female for testing for genetic disease carrier status for procreative management: Secondary | ICD-10-CM

## 2023-10-04 DIAGNOSIS — Z3A20 20 weeks gestation of pregnancy: Secondary | ICD-10-CM | POA: Diagnosis not present

## 2023-10-04 DIAGNOSIS — Z3480 Encounter for supervision of other normal pregnancy, unspecified trimester: Secondary | ICD-10-CM

## 2023-10-04 DIAGNOSIS — Z3402 Encounter for supervision of normal first pregnancy, second trimester: Secondary | ICD-10-CM

## 2023-10-04 DIAGNOSIS — R87612 Low grade squamous intraepithelial lesion on cytologic smear of cervix (LGSIL): Secondary | ICD-10-CM | POA: Insufficient documentation
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
## 2023-10-05 LAB — CBC/D/PLT+RPR+RH+ABO+RUBIGG...
Antibody Screen: NEGATIVE
Basophils Absolute: 0 10*3/uL (ref 0.0–0.2)
Basos: 0 %
EOS (ABSOLUTE): 0 10*3/uL (ref 0.0–0.4)
Eos: 1 %
HCV Ab: NONREACTIVE
HIV Screen 4th Generation wRfx: NONREACTIVE
Hematocrit: 33.5 % — ABNORMAL LOW (ref 34.0–46.6)
Hemoglobin: 11.1 g/dL (ref 11.1–15.9)
Hepatitis B Surface Ag: NEGATIVE
Immature Grans (Abs): 0 10*3/uL (ref 0.0–0.1)
Immature Granulocytes: 0 %
Lymphocytes Absolute: 1.8 10*3/uL (ref 0.7–3.1)
Lymphs: 22 %
MCH: 32.4 pg (ref 26.6–33.0)
MCHC: 33.1 g/dL (ref 31.5–35.7)
MCV: 98 fL — ABNORMAL HIGH (ref 79–97)
Monocytes Absolute: 0.8 10*3/uL (ref 0.1–0.9)
Monocytes: 9 %
Neutrophils Absolute: 5.8 10*3/uL (ref 1.4–7.0)
Neutrophils: 68 %
Platelets: 296 10*3/uL (ref 150–450)
RBC: 3.43 x10E6/uL — ABNORMAL LOW (ref 3.77–5.28)
RDW: 12.3 % (ref 11.7–15.4)
RPR Ser Ql: NONREACTIVE
Rh Factor: POSITIVE
Rubella Antibodies, IGG: 2.23 {index} (ref >0.99)
WBC: 8.5 10*3/uL (ref 3.4–10.8)

## 2023-10-05 LAB — HCV INTERPRETATION

## 2023-10-06 LAB — AFP, SERUM, OPEN SPINA BIFIDA
AFP MoM: 1.25
AFP Value: 95.9 ng/mL
Gest. Age on Collection Date: 20 wk
Maternal Age At EDD: 22.4 a
OSBR Risk 1 IN: 10000
Test Results:: NEGATIVE
Weight: 111 [lb_av]

## 2023-10-10 LAB — PANORAMA PRENATAL TEST FULL PANEL:PANORAMA TEST PLUS 5 ADDITIONAL MICRODELETIONS: FETAL FRACTION: 7.2

## 2023-10-11 ENCOUNTER — Telehealth: Payer: Self-pay

## 2023-10-11 LAB — HORIZON CUSTOM: REPORT SUMMARY: NEGATIVE

## 2023-10-19 ENCOUNTER — Encounter: Admitting: Advanced Practice Midwife

## 2023-10-20 ENCOUNTER — Ambulatory Visit: Payer: Self-pay | Admitting: Licensed Clinical Social Worker

## 2023-10-20 NOTE — BH Specialist Note (Unsigned)
 Integrated Behavioral Health via Telemedicine Visit  10/20/2023 Morgan Haas [id number]  Number of Integrated Behavioral Health Clinician visits: No data recorded Session Start time: No data recorded  Session End time: No data recorded Total time in minutes: No data recorded   Referring Provider: *** Patient/Family location: *** [facility] Provider location: *** All persons participating in visit: *** Types of Service: {CHL AMB TYPE OF [phone]}  I connected with Morgan Haas and/or Morgan Haas's {family members:20773} via  Telephone or Engineer, civil (consulting)  (Video is Surveyor, mining) and verified that I am speaking with the correct person using two identifiers. Discussed confidentiality: {YES/NO:21197}  I discussed the limitations of telemedicine and the availability of in person appointments.  Discussed there is a possibility of technology failure and discussed alternative modes of communication if that failure occurs.  I discussed that engaging in this telemedicine visit, they consent to the provision of behavioral healthcare and the services will be billed under their insurance.  Patient and/or legal guardian expressed understanding and consented to Telemedicine visit: {YES/NO:21197}  Presenting Concerns: Patient and/or family reports the following symptoms/concerns: *** Duration of problem: ***; Severity of problem: {Mild/Moderate/Severe:20260}  Patient and/or Family's Strengths/Protective Factors: {CHL AMB BH PROTECTIVE FACTORS:[phone]}  Goals Addressed: Patient will:  Reduce symptoms of: {IBH Symptoms:21014056}   Increase knowledge and/or ability of: {IBH Patient Tools:21014057}   Demonstrate ability to: {IBH Goals:21014053}  Progress towards Goals: {CHL AMB BH PROGRESS TOWARDS [phone]}    Interventions: Interventions utilized:  {IBH Interventions:21014054} Standardized Assessments completed: {IBH Screening  Tools:21014051}    Patient and/or Family Response: ***  Clinical Assessment/Diagnosis  No diagnosis found.    Assessment: Patient currently experiencing ***.   Patient may benefit from ***.  Plan: Follow up with behavioral health clinician on : *** Behavioral recommendations: *** Referral(s): {IBH Referrals:21014055}  I discussed the assessment and treatment plan with the patient and/or parent/guardian. They were provided an opportunity to ask questions and all were answered. They agreed with the plan and demonstrated an understanding of the instructions.   They were advised to call back or seek an in-person evaluation if the symptoms worsen or if the condition fails to improve as anticipated.  Morgan Haas, LCSWA

## 2023-10-25 ENCOUNTER — Ambulatory Visit: Payer: Self-pay | Admitting: Licensed Clinical Social Worker

## 2023-10-25 DIAGNOSIS — F4322 Adjustment disorder with anxiety: Secondary | ICD-10-CM

## 2023-10-25 NOTE — BH Specialist Note (Unsigned)
 Integrated Behavioral Health via Telemedicine Visit  10/25/2023 Morgan Haas [id number]  Number of Integrated Behavioral Health Clinician visits: No data recorded Session Start time: No data recorded   Session End time: No data recorded Total time in minutes: No data recorded   Referring Provider: *** Patient/Family location: *** [facility] Provider location: *** All persons participating in visit: *** Types of Service: {CHL AMB TYPE OF [phone]}  I connected with [name] and/or Inocente Germani's {family members:20773} via  Telephone or Engineer, civil (consulting)  (Video is Surveyor, mining) and verified that I am speaking with the correct person using two identifiers. Discussed confidentiality: {YES/NO:21197}  I discussed the limitations of telemedicine and the availability of in person appointments.  Discussed there is a possibility of technology failure and discussed alternative modes of communication if that failure occurs.  I discussed that engaging in this telemedicine visit, they consent to the provision of behavioral healthcare and the services will be billed under their insurance.  Patient and/or legal guardian expressed understanding and consented to Telemedicine visit: {YES/NO:21197}  Presenting Concerns: Patient and/or family reports the following symptoms/concerns: *** Duration of problem: ***; Severity of problem: {Mild/Moderate/Severe:20260}  Patient and/or Family's Strengths/Protective Factors: {CHL AMB BH PROTECTIVE FACTORS:[phone]}  Goals Addressed: Patient will:  Reduce symptoms of: {IBH Symptoms:21014056}   Increase knowledge and/or ability of: {IBH Patient Tools:21014057}   Demonstrate ability to: {IBH Goals:21014053}  Progress towards Goals: {CHL AMB BH PROGRESS TOWARDS GOALS:[phone]}    Interventions: Interventions utilized:  {IBH Interventions:21014054} Standardized Assessments completed: {IBH Screening  Tools:21014051}    Patient and/or Family Response: Patient was present for today's virtual appointment. She reports not sure why there was a BH referral. She's currently pregnant and she's in school, majoring in psychology. She's having a boy and she's really excited about her pregnancy.   [facility] provided patient with a self referral form for doula services. Concerned about labor and delivery and what to expect.   She reports her only stressor at this time getting a job.   Mom passed away in November 06, 2020 from Cancer.  Injuried in the [employer] in 11-06-20  [employer] [facility]	[place] for 2 months   Clinical Assessment/Diagnosis  No diagnosis found.    Assessment: Patient currently experiencing ***.   Patient may benefit from ***.  Plan: Follow up with behavioral health clinician on : *** Behavioral recommendations: *** Referral(s): {IBH Referrals:21014055}  I discussed the assessment and treatment plan with the patient and/or parent/guardian. They were provided an opportunity to ask questions and all were answered. They agreed with the plan and demonstrated an understanding of the instructions.   They were advised to call back or seek an in-person evaluation if the symptoms worsen or if the condition fails to improve as anticipated.  Chrles Selley LITTIE Seats, LCSWA

## 2023-10-29 ENCOUNTER — Ambulatory Visit

## 2023-10-29 ENCOUNTER — Other Ambulatory Visit

## 2023-10-29 DIAGNOSIS — Z3480 Encounter for supervision of other normal pregnancy, unspecified trimester: Secondary | ICD-10-CM

## 2023-11-01 ENCOUNTER — Ambulatory Visit (INDEPENDENT_AMBULATORY_CARE_PROVIDER_SITE_OTHER): Admitting: Obstetrics and Gynecology

## 2023-11-01 ENCOUNTER — Other Ambulatory Visit (HOSPITAL_COMMUNITY)
Admission: RE | Admit: 2023-11-01 | Discharge: 2023-11-01 | Disposition: A | Discharge status: Home / Self Care | Source: Ambulatory Visit | Attending: Obstetrics and Gynecology | Admitting: Obstetrics and Gynecology

## 2023-11-01 ENCOUNTER — Ambulatory Visit: Payer: Self-pay | Admitting: Licensed Clinical Social Worker

## 2023-11-01 VITALS — BP 98/58 | HR 76 | Wt 115.0 lb

## 2023-11-01 DIAGNOSIS — Z3480 Encounter for supervision of other normal pregnancy, unspecified trimester: Secondary | ICD-10-CM | POA: Insufficient documentation

## 2023-11-01 DIAGNOSIS — Z113 Encounter for screening for infections with a predominantly sexual mode of transmission: Secondary | ICD-10-CM | POA: Insufficient documentation

## 2023-11-01 DIAGNOSIS — O98319 Other infections with a predominantly sexual mode of transmission complicating pregnancy, unspecified trimester: Secondary | ICD-10-CM | POA: Diagnosis not present

## 2023-11-01 DIAGNOSIS — R87612 Low grade squamous intraepithelial lesion on cytologic smear of cervix (LGSIL): Secondary | ICD-10-CM | POA: Diagnosis not present

## 2023-11-01 DIAGNOSIS — Z3A24 24 weeks gestation of pregnancy: Secondary | ICD-10-CM | POA: Diagnosis not present

## 2023-11-01 DIAGNOSIS — Z91199 Patient's noncompliance with other medical treatment and regimen due to unspecified reason: Secondary | ICD-10-CM

## 2023-11-01 DIAGNOSIS — A630 Anogenital (venereal) warts: Secondary | ICD-10-CM
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
## 2023-11-01 NOTE — Patient Instructions (Signed)
 It was nice meeting you today! You will see your results in the MyChart app within 1 week. Please do not eat or drink anything besides water for the 8 hours before your next appointment.

## 2023-11-02 LAB — CERVICOVAGINAL ANCILLARY ONLY
Chlamydia: NEGATIVE
Comment: NEGATIVE
Comment: NEGATIVE
Comment: NORMAL
Neisseria Gonorrhea: NEGATIVE
Trichomonas: NEGATIVE

## 2023-11-02 NOTE — BH Specialist Note (Signed)
 [facility] provided virtual link to patient on this date. [facility] contacted patient and left a message. Patient no showed for today's visit.

## 2023-11-03 ENCOUNTER — Ambulatory Visit: Payer: Self-pay | Admitting: Obstetrics and Gynecology

## 2023-11-08 ENCOUNTER — Ambulatory Visit: Admitting: Licensed Clinical Social Worker

## 2023-11-08 DIAGNOSIS — F4322 Adjustment disorder with anxiety: Secondary | ICD-10-CM | POA: Diagnosis not present
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
## 2023-11-09 ENCOUNTER — Ambulatory Visit: Admitting: Advanced Practice Midwife

## 2023-11-09 ENCOUNTER — Encounter: Payer: Self-pay | Admitting: Advanced Practice Midwife

## 2023-11-09 VITALS — BP 98/61 | HR 78 | Wt 117.8 lb

## 2023-11-09 DIAGNOSIS — Z3A25 25 weeks gestation of pregnancy: Secondary | ICD-10-CM

## 2023-11-09 DIAGNOSIS — Z3480 Encounter for supervision of other normal pregnancy, unspecified trimester: Secondary | ICD-10-CM

## 2023-11-09 DIAGNOSIS — Z3482 Encounter for supervision of other normal pregnancy, second trimester: Secondary | ICD-10-CM | POA: Diagnosis not present

## 2023-11-09 DIAGNOSIS — B081 Molluscum contagiosum: Secondary | ICD-10-CM | POA: Diagnosis not present

## 2023-11-09 NOTE — Progress Notes (Signed)
 Here for TCA treatment and ROB.   No other concerns at this time.

## 2023-11-09 NOTE — Progress Notes (Signed)
   PRENATAL VISIT NOTE  Subjective:  Morgan Haas is a 23 y.o. G1P0000 at [redacted]w[redacted]d being seen today for ongoing prenatal care.  She is currently monitored for the following issues for this low-risk pregnancy and has Eczema; Supervision of other normal pregnancy, antepartum; LGSIL on Pap smear of cervix; and Maternal condyloma acuminatum affecting pregnancy, antepartum on their problem list.  Patient reports no complaints.  Contractions: Not present. Vag. Bleeding: None.  Movement: Increased. Denies leaking of fluid.   The following portions of the patient's history were reviewed and updated as appropriate: allergies, current medications, past family history, past medical history, past social history, past surgical history and problem list.   Objective:    Vitals:   11/09/23 1321  BP: 98/61  Pulse: 78  Weight: 117 lb 12.8 oz (53.4 kg)    Fetal Status:  Fetal Heart Rate (bpm): 136   Movement: Increased    General: Alert, oriented and cooperative. Patient is in no acute distress.  Skin: Skin is warm and dry. No rash noted.   Cardiovascular: Normal heart rate noted  Respiratory: Normal respiratory effort, no problems with respiration noted  Abdomen: Soft, gravid, appropriate for gestational age.  Pain/Pressure: Absent     Pelvic: Cervical exam deferred        Extremities: Normal range of motion.  Edema: None  Mental Status: Normal mood and affect. Normal behavior. Normal judgment and thought content.   Assessment and Plan:  Pregnancy: G1P0000 at [redacted]w[redacted]d  1. Supervision of other normal pregnancy, antepartum (Primary) --Anticipatory guidance about next visits/weeks of pregnancy given.   2. [redacted] weeks gestation of pregnancy   3. Molluscum contagiosum --Pt using Aldara  prior to pregnancy for dx of condyloma. Stopped Aldara  due to concerns/risk in pregnancy.  On exam today, lesions most c/w molluscum.   --TCA applied to 8-10 small lesions scattered over labia/groin/buttocks. Vaseline  applied first around each raised lesion, then acid applied with cotton swab.  After each lesion treated, baking soda paste applied to each for pt comfort.  Pt tolerated well.   Preterm labor symptoms and general obstetric precautions including but not limited to vaginal bleeding, contractions, leaking of fluid and fetal movement were reviewed in detail with the patient. Please refer to After Visit Summary for other counseling recommendations.   Return for As scheduled, GTT at next visit.  Future Appointments  Date Time Provider Department Center  11/15/2023  2:15 PM Sherrine Marcelyn CROME, [place] [facility] None  11/24/2023  1:00 PM [facility] PROVIDER 1 [facility]  11/24/2023  1:30 PM [facility] US5 [facility]  11/29/2023  8:00 AM [facility] LAB [facility] None  11/29/2023  8:55 AM Erik Kieth BROCKS, MD [facility] None    Olam Boards, CNM

## 2023-11-15 ENCOUNTER — Ambulatory Visit: Admitting: Licensed Clinical Social Worker

## 2023-11-15 DIAGNOSIS — Z91199 Patient's noncompliance with other medical treatment and regimen due to unspecified reason: Secondary | ICD-10-CM

## 2023-11-17 ENCOUNTER — Ambulatory Visit: Admitting: Licensed Clinical Social Worker

## 2023-11-17 DIAGNOSIS — F4322 Adjustment disorder with anxiety: Secondary | ICD-10-CM | POA: Diagnosis not present

## 2023-11-17 NOTE — BH Specialist Note (Signed)
 Integrated Behavioral Health via Telemedicine Visit  11/21/2023 Odeal Welden [id number]  Number of Integrated Behavioral Health Clinician visits: 3- Third Visit  Session Start time: 1345   Session End time: 1458  Total time in minutes: 73    Referring Provider: Dr. Zina  Patient/Family location: At [facility] Provider location: Remote Office  All persons participating in visit: [facility] and Patient Types of Service: Individual psychotherapy and Video visit  I connected with [name] and/or Inocente Mcfall's patient via  Telephone or Engineer, civil (consulting)  (Video is Surveyor, mining) and verified that I am speaking with the correct person using two identifiers. Discussed confidentiality: Yes   I discussed the limitations of telemedicine and the availability of in person appointments.  Discussed there is a possibility of technology failure and discussed alternative modes of communication if that failure occurs.  I discussed that engaging in this telemedicine visit, they consent to the provision of behavioral healthcare and the services will be billed under their insurance.  Patient and/or legal guardian expressed understanding and consented to Telemedicine visit: Yes   Presenting Concerns: Patient and/or family reports the following symptoms/concerns: Improvements with mood and anxiety symptoms.  Duration of problem: Months; Severity of problem: moderate  Patient and/or Family's Strengths/Protective Factors: Social and Emotional competence, Concrete supports in place (healthy food, safe environments, etc.), Physical Health (exercise, healthy diet, medication compliance, etc.), and Caregiver has knowledge of parenting & child development  Goals Addressed: Patient will:  Reduce symptoms of: agitation and anxiety   Increase knowledge and/or ability of: coping skills, healthy habits, and self-management skills   Demonstrate ability to: Increase healthy adjustment  to current life circumstances and Increase adequate support systems for patient/family  Progress towards Goals: Ongoing    Interventions: Interventions utilized:  Solution-Focused Strategies, Supportive Counseling, Psychoeducation and/or Health Education, Communication Skills, and Supportive Reflection Standardized Assessments completed: Not Needed    Patient and/or Family Response: Patient was present for today's session and reported increased worry regarding her grandmother, who is currently hospitalized and refusing dialysis. Patient expressed concern due to her grandmother's dislike of hospital settings. Despite this stressor, she stated that both she and her baby are doing well overall. Patient shared that her relationships with family members have been positive, with no recent conflicts or arguments. She noted an increased appetite and some weight gain, acknowledging that her belly appears to be getting bigger as baby grows. During the session, patient also processed feelings related to the father of her baby, who is currently incarcerated. Although uncertain about his release date, she reported having daily positive conversations with him and described him as being in a stable and healthy mental state.  Clinical Assessment/Diagnosis  Adjustment disorder with anxious mood    Assessment: Patient currently experiencing increased worry about her hospitalized grandmother who is refusing dialysis, alongside some emotional processing related to the father of her baby being incarcerated. She also reports increased appetite and weight gain, but overall states she and her baby are doing well, with stable family relationships..   Patient may benefit from continued support of integrated behavioral health services.  Plan: Follow up with behavioral health clinician on : 12/01/23 Behavioral recommendations: Patient to continue processing emotional stressors in a supportive environment, particularly  around her grandmother's health and the father's incarceration. Additionally, patient is encouraged to engage in self-care practices to reduce anxiety symptoms and continuing to maintain a well balanced nutrition.  Referral(s): Integrated [employer] (In Clinic)  I discussed the assessment and  treatment plan with the patient and/or parent/guardian. They were provided an opportunity to ask questions and all were answered. They agreed with the plan and demonstrated an understanding of the instructions.   They were advised to call back or seek an in-person evaluation if the symptoms worsen or if the condition fails to improve as anticipated.  Danasha Melman LITTIE Seats, LCSWA

## 2023-11-18 NOTE — BH Specialist Note (Signed)
 Virtual link was sent to patient on this date. Patient advised she would have to cancel visit today as her grandmother is in the hospital. [facility] visit has been rescheduled.

## 2023-11-24 ENCOUNTER — Ambulatory Visit: Attending: Obstetrics and Gynecology | Admitting: Obstetrics and Gynecology

## 2023-11-24 ENCOUNTER — Other Ambulatory Visit: Payer: Self-pay | Admitting: *Deleted

## 2023-11-24 ENCOUNTER — Other Ambulatory Visit: Payer: Self-pay | Admitting: Obstetrics and Gynecology

## 2023-11-24 ENCOUNTER — Ambulatory Visit (HOSPITAL_BASED_OUTPATIENT_CLINIC_OR_DEPARTMENT_OTHER)

## 2023-11-24 VITALS — BP 106/59 | HR 80

## 2023-11-24 DIAGNOSIS — Z3A28 28 weeks gestation of pregnancy: Secondary | ICD-10-CM | POA: Diagnosis not present

## 2023-11-24 DIAGNOSIS — O43193 Other malformation of placenta, third trimester: Secondary | ICD-10-CM

## 2023-11-24 DIAGNOSIS — Z3480 Encounter for supervision of other normal pregnancy, unspecified trimester: Secondary | ICD-10-CM

## 2023-11-24 DIAGNOSIS — Z363 Encounter for antenatal screening for malformations: Secondary | ICD-10-CM | POA: Diagnosis not present

## 2023-11-24 DIAGNOSIS — O3680X0 Pregnancy with inconclusive fetal viability, not applicable or unspecified: Secondary | ICD-10-CM

## 2023-11-24 DIAGNOSIS — J302 Other seasonal allergic rhinitis: Secondary | ICD-10-CM

## 2023-11-24 DIAGNOSIS — O43199 Other malformation of placenta, unspecified trimester: Secondary | ICD-10-CM

## 2023-11-24 DIAGNOSIS — Z3689 Encounter for other specified antenatal screening: Secondary | ICD-10-CM

## 2023-11-24 NOTE — Progress Notes (Signed)
 Maternal-Fetal Medicine Consultation Name: Morgan Haas MRN: [id number]  G1 P0 at 28w gestation.  Patient is here for fetal anatomy scan. On cell-free fetal DNA screening, the risks of aneuploidies are not increased. MSAFP screening showed low risk for open-neural tube defects. Ultrasound We performed fetal anatomy scan. No makers of aneuploidies or fetal structural defects are seen. Fetal biometry is consistent with her previously-established dates. Amniotic fluid is normal and good fetal activity is seen.  Marginal cord insertion is seen. Patient understands the limitations of ultrasound in detecting fetal anomalies.  I explained the finding of marginal cord insertion and reassured her that in most cases no fetal adverse outcomes are expected.  However, marginal cord insertion can be associated with fetal growth restriction.  I recommended a follow-up fetal growth assessment in 4 weeks.  Recommendations - An appointment was made for her to return in 4 weeks for fetal growth assessment    Consultation including face-to-face (more than 50%) counseling 15 minutes.

## 2023-11-29 ENCOUNTER — Other Ambulatory Visit

## 2023-11-29 ENCOUNTER — Ambulatory Visit (INDEPENDENT_AMBULATORY_CARE_PROVIDER_SITE_OTHER): Payer: Self-pay | Admitting: Obstetrics and Gynecology

## 2023-11-29 VITALS — BP 117/69 | HR 73 | Wt 125.0 lb

## 2023-11-29 DIAGNOSIS — Z3480 Encounter for supervision of other normal pregnancy, unspecified trimester: Secondary | ICD-10-CM

## 2023-11-29 DIAGNOSIS — O43199 Other malformation of placenta, unspecified trimester: Secondary | ICD-10-CM | POA: Insufficient documentation

## 2023-11-29 DIAGNOSIS — Z23 Encounter for immunization: Secondary | ICD-10-CM

## 2023-11-29 DIAGNOSIS — Z3A28 28 weeks gestation of pregnancy: Secondary | ICD-10-CM

## 2023-11-29 NOTE — Progress Notes (Signed)
   PRENATAL VISIT NOTE  Subjective:  Morgan Haas is a 23 y.o. G1P0000 at [redacted]w[redacted]d being seen today for ongoing prenatal care.  She is currently monitored for the following issues for this low-risk pregnancy and has Eczema; Supervision of other normal pregnancy, antepartum; LGSIL on Pap smear of cervix; Maternal condyloma acuminatum affecting pregnancy, antepartum; and Marginal insertion of umbilical cord affecting management of mother on their problem list.  Patient reports doing well overall. Noting some back pain and round ligament pain.  Contractions: Not present. Vag. Bleeding: None.  Movement: Present. Denies leaking of fluid.   The following portions of the patient's history were reviewed and updated as appropriate: allergies, current medications, past family history, past medical history, past social history, past surgical history and problem list.   Objective:   Vitals:   11/29/23 0830  BP: 117/69  Pulse: 73  Weight: 125 lb (56.7 kg)    Fetal Status: Fetal Heart Rate (bpm): 135   Movement: Present     General:  Alert, oriented and cooperative. Patient is in no acute distress.  Skin: Skin is warm and dry. No rash noted.   Cardiovascular: Normal heart rate noted  Respiratory: Normal respiratory effort, no problems with respiration noted  Abdomen: Soft, gravid, appropriate for gestational age.  Pain/Pressure: Absent      Assessment and Plan:  Pregnancy: G1P0000 at [redacted]w[redacted]d 1. Supervision of other normal pregnancy, antepartum (Primary) 2. [redacted] weeks gestation of pregnancy Normal anatomy US  w/ exception of marginal cord insertion - Glucose Tolerance, 2 Hours w/1 Hour - RPR - CBC - HIV antibody (with reflex) - Tdap vaccine greater than or equal to 7yo IM - HCV Ab w Reflex to Quant PCR; Future - Hepatitis B Surface AntiGEN  3. Marginal insertion of umbilical cord affecting management of mother Serial growth US  - next scheduled  4. Adjustment disorder F/w BH  Please refer to  After Visit Summary for other counseling recommendations.   Return in about 2 weeks (around 12/13/2023) for return OB at 30 weeks.  Future Appointments  Date Time Provider Department Center  12/01/2023  1:45 PM Sherrine Marcelyn LITTIE ISRAEL [facility] None  12/23/2023  3:30 PM [facility] PROVIDER 1 [facility]  12/23/2023  3:45 PM [facility] US3 [facility]    Kieth JAYSON Carolin, MD

## 2023-11-30 ENCOUNTER — Ambulatory Visit: Payer: Self-pay | Admitting: Obstetrics and Gynecology

## 2023-11-30 DIAGNOSIS — Z3480 Encounter for supervision of other normal pregnancy, unspecified trimester: Secondary | ICD-10-CM

## 2023-11-30 LAB — RPR: RPR Ser Ql: NONREACTIVE

## 2023-11-30 LAB — GLUCOSE TOLERANCE, 2 HOURS W/ 1HR
Glucose, 1 hour: 93 mg/dL (ref 70–179)
Glucose, 2 hour: 92 mg/dL (ref 70–152)
Glucose, Fasting: 75 mg/dL (ref 70–91)

## 2023-11-30 LAB — CBC
Hematocrit: 32.7 % — ABNORMAL LOW (ref 34.0–46.6)
Hemoglobin: 11 g/dL — ABNORMAL LOW (ref 11.1–15.9)
MCH: 32.6 pg (ref 26.6–33.0)
MCHC: 33.6 g/dL (ref 31.5–35.7)
MCV: 97 fL (ref 79–97)
Platelets: 263 x10E3/uL (ref 150–450)
RBC: 3.37 x10E6/uL — ABNORMAL LOW (ref 3.77–5.28)
RDW: 12.1 % (ref 11.7–15.4)
WBC: 9.3 x10E3/uL (ref 3.4–10.8)

## 2023-11-30 LAB — HCV INTERPRETATION

## 2023-11-30 LAB — HCV AB W REFLEX TO QUANT PCR: HCV Ab: NONREACTIVE

## 2023-11-30 LAB — HIV ANTIBODY (ROUTINE TESTING W REFLEX): HIV Screen 4th Generation wRfx: NONREACTIVE

## 2023-11-30 LAB — HEPATITIS B SURFACE ANTIGEN: Hepatitis B Surface Ag: NEGATIVE

## 2023-12-01 ENCOUNTER — Ambulatory Visit: Admitting: Licensed Clinical Social Worker

## 2023-12-01 DIAGNOSIS — F4322 Adjustment disorder with anxiety: Secondary | ICD-10-CM

## 2023-12-01 NOTE — BH Specialist Note (Signed)
 Integrated Behavioral Health via Telemedicine Visit  12/13/2023 Aquanetta Schwarz [id number]  Number of Integrated Behavioral Health Clinician visits: 4- Fourth Visit  Session Start time: 1345   Session End time: 1421  Total time in minutes: 36    Referring Provider: Dr. Zina Patient/Family location: At [facility] Provider location: Remote Office All persons participating in visit: Patient and [facility] Types of Service: Individual psychotherapy and Video visit  I connected with [name] and/or Inocente Corne's patient via  Telephone or Engineer, civil (consulting)  (Video is Surveyor, mining) and verified that I am speaking with the correct person using two identifiers. Discussed confidentiality: Yes   I discussed the limitations of telemedicine and the availability of in person appointments.  Discussed there is a possibility of technology failure and discussed alternative modes of communication if that failure occurs.  I discussed that engaging in this telemedicine visit, they consent to the provision of behavioral healthcare and the services will be billed under their insurance.  Patient and/or legal guardian expressed understanding and consented to Telemedicine visit: Yes   Presenting Concerns: Patient and/or family reports the following symptoms/concerns: Ongoing improvements with mood  Duration of problem: Months; Severity of problem: moderate  Patient and/or Family's Strengths/Protective Factors: Social and Emotional competence, Concrete supports in place (healthy food, safe environments, etc.), Physical Health (exercise, healthy diet, medication compliance, etc.), and Caregiver has knowledge of parenting & child development  Goals Addressed: Patient will:  Reduce symptoms of: agitation and anxiety   Increase knowledge and/or ability of: coping skills, healthy habits, and self-management skills   Demonstrate ability to: Increase healthy adjustment to current life  circumstances and Increase adequate support systems for patient/family  Progress towards Goals: Ongoing    Interventions: Interventions utilized:  Mindfulness or Management consultant, Supportive Counseling, Psychoeducation and/or Health Education, Communication Skills, and Supportive Reflection Standardized Assessments completed: Not Needed    Patient and/or Family Response: Patient was present for today's virtual session. She reported that her recent prenatal appointment went well, including a positive anatomy scan, normal glucose test results, and healthy fetal growth and heart rate. Patient noted a six-pound weight gain over the past week but did not express concern and reported no significant stressors at this time. She discussed recent reflections related to her baby shower planning, expressing disappointment that friends and family who initially offered support have not followed through. While she acknowledged feeling let down, she also stated she has come to terms with managing things independently and has gained insight into which relationships are genuinely supportive.  Clinical Assessment/Diagnosis  Adjustment disorder with anxious mood    Assessment: Patient currently experiencing general emotional stability and positive prenatal health, alongside mild interpersonal disappointment related to a lack of support from friends and family with baby shower planning..   Patient may benefit from continued support of integrated behavioral health services.  Plan: Follow up with behavioral health clinician on : 12/15/2023 Behavioral recommendations: Continue exploring emotional boundaries and relationship expectations in therapy. Support patient's coping strategies for managing interpersonal stress and reinforce self-efficacy in preparing for motherhood. Referral(s): Integrated [employer] (In Clinic)  I discussed the assessment and treatment plan with the patient and/or  parent/guardian. They were provided an opportunity to ask questions and all were answered. They agreed with the plan and demonstrated an understanding of the instructions.   They were advised to call back or seek an in-person evaluation if the symptoms worsen or if the condition fails to improve as anticipated.  Ariyana Faw L  Sherrine, LCSWA

## 2023-12-14 ENCOUNTER — Encounter: Admitting: Physician Assistant

## 2023-12-14 NOTE — Progress Notes (Deleted)
   PRENATAL VISIT NOTE  Subjective:  Morgan Haas is a 23 y.o. G1P0000 at [redacted]w[redacted]d being seen today for ongoing prenatal care.  She is currently monitored for the following issues for this {Blank single:19197::high-risk,low-risk} pregnancy and has Eczema; Supervision of other normal pregnancy, antepartum; LGSIL on Pap smear of cervix; Maternal condyloma acuminatum affecting pregnancy, antepartum; and Marginal insertion of umbilical cord affecting management of mother on their problem list.  Patient reports {sx:14538}.   .  .   . Denies leaking of fluid.   The following portions of the patient's history were reviewed and updated as appropriate: allergies, current medications, past family history, past medical history, past social history, past surgical history and problem list.   Objective:    There were no vitals filed for this visit.  Fetal Status:           General: Alert, oriented and cooperative. Patient is in no acute distress.  Skin: Skin is warm and dry. No rash noted.   Cardiovascular: Normal heart rate noted  Respiratory: Normal respiratory effort, no problems with respiration noted  Abdomen: Soft, gravid, appropriate for gestational age.        Pelvic: Cervical exam deferred        Extremities: Normal range of motion.     Mental Status: Normal mood and affect. Normal behavior. Normal judgment and thought content.   Assessment and Plan:  Pregnancy: G1P0000 at [redacted]w[redacted]d  1. Supervision of other normal pregnancy, antepartum (Primary) Patient doing well, feeling regular fetal movement  BP, FHR, FH appropriate  2. [redacted] weeks gestation of pregnancy Anticipatory guidance about next visits/weeks of pregnancy given.   3. Marginal insertion of umbilical cord affecting management of mother ***  Preterm labor symptoms and general obstetric precautions including but not limited to vaginal bleeding, contractions, leaking of fluid and fetal movement were reviewed in detail with the  patient. Please refer to After Visit Summary for other counseling recommendations.   No follow-ups on file.  Future Appointments  Date Time Provider Department Center  12/14/2023  9:35 AM Aidian Salomon E, PA-C [facility] None  12/15/2023  1:45 PM Sherrine Marcelyn LITTIE ISRAEL [facility] None  12/23/2023  3:30 PM [facility] PROVIDER 1 [facility]  12/23/2023  3:45 PM [facility] US3 [facility]    Jorene FORBES Moats, PA-C

## 2023-12-15 ENCOUNTER — Ambulatory Visit: Admitting: Licensed Clinical Social Worker

## 2023-12-15 DIAGNOSIS — F4322 Adjustment disorder with anxiety: Secondary | ICD-10-CM | POA: Diagnosis not present

## 2023-12-15 NOTE — BH Specialist Note (Unsigned)
 Integrated Behavioral Health via Telemedicine Visit  12/22/2023 Antonique Langford [id number]  Number of Integrated Behavioral Health Clinician visits: 5-Fifth Visit  Session Start time: 1345   Session End time: 1426  Total time in minutes: 41    Referring Provider: Dr. Zina Patient/Family location: At home [facility] Provider location: Remote Office All persons participating in visit: Patient and [facility] Types of Service: Individual psychotherapy and Video visit  I connected with [name] and/or Inocente Teal's patient via  Telephone or Engineer, civil (consulting)  (Video is Surveyor, mining) and verified that I am speaking with the correct person using two identifiers. Discussed confidentiality: Yes   I discussed the limitations of telemedicine and the availability of in person appointments.  Discussed there is a possibility of technology failure and discussed alternative modes of communication if that failure occurs.  I discussed that engaging in this telemedicine visit, they consent to the provision of behavioral healthcare and the services will be billed under their insurance.  Patient and/or legal guardian expressed understanding and consented to Telemedicine visit: Yes   Presenting Concerns: Patient and/or family reports the following symptoms/concerns: Interpersonal request eing.  Duration of problem: Months; Severity of problem: moderate  Patient and/or Family's Strengths/Protective Factors: Social and Emotional competence, Concrete supports in place (healthy food, safe environments, etc.), Physical Health (exercise, healthy diet, medication compliance, etc.), and Caregiver has knowledge of parenting & child development  Goals Addressed: Patient will:  Reduce symptoms of: anxiety and depression   Increase knowledge and/or ability of: coping skills, healthy habits, and self-management skills   Demonstrate ability to: Increase healthy adjustment to current life  circumstances and Increase adequate support systems for patient/family  Progress towards Goals: Ongoing    Interventions: Interventions utilized:  Mindfulness or Relaxation Training, Supportive Counseling, Psychoeducation and/or Health Education, and Supportive Reflection Standardized Assessments completed: Not Needed    Patient and/or Family Response: Patient was present for today's virtual session. She shared that she is prepared and excited to begin online classes tomorrow, majoring in psychology. Patient described a renewed sense of motivation and commitment to her educational goals, identifying her son as a primary source of inspiration. She reports that her pregnancy is progressing well, with positive updates from recent medical appointments and healthy fetal development. Patient also processed a recent argument with her son's father, stating that they are no longer in communication due to infidelity on his part. She acknowledged experiencing trust issues and emotional distress as a result of the situation but was able to reflect on the impact of the relationship on her emotional well-being.  Clinical Assessment/Diagnosis  Adjustment disorder with anxious mood    Assessment: Patient presents as motivated and future-focused regarding her education and pregnancy, while also navigating interpersonal stress related to relationship conflict and betrayal. She demonstrates insight and emotional awareness..   Patient may benefit from continued support of integrated behavioral health services.  Plan: Follow up with behavioral health clinician on : 12/29/2023 Behavioral recommendations: Encourage continued engagement with academic goals and use of positive supports during this transition. Support emotional processing related to the relationship and explore strategies for establishing healthy boundaries. Monitor for any increases in stress or mood-related symptoms as she adjusts to school  responsibilities. Recommend continued use of coping strategies and provide space for ongoing emotional support in future sessions. Referral(s): Integrated [employer] (In Clinic)  I discussed the assessment and treatment plan with the patient and/or parent/guardian. They were provided an opportunity to ask questions and all were answered.  They agreed with the plan and demonstrated an understanding of the instructions.   They were advised to call back or seek an in-person evaluation if the symptoms worsen or if the condition fails to improve as anticipated.  Franchon Ketterman LITTIE Seats, LCSWA

## 2023-12-20 ENCOUNTER — Encounter: Payer: Self-pay | Admitting: Advanced Practice Midwife

## 2023-12-23 ENCOUNTER — Other Ambulatory Visit: Payer: Self-pay | Admitting: *Deleted

## 2023-12-23 ENCOUNTER — Ambulatory Visit (HOSPITAL_BASED_OUTPATIENT_CLINIC_OR_DEPARTMENT_OTHER)

## 2023-12-23 ENCOUNTER — Ambulatory Visit: Attending: Obstetrics and Gynecology | Admitting: Maternal & Fetal Medicine

## 2023-12-23 DIAGNOSIS — Z3A32 32 weeks gestation of pregnancy: Secondary | ICD-10-CM | POA: Insufficient documentation

## 2023-12-23 DIAGNOSIS — O365920 Maternal care for other known or suspected poor fetal growth, second trimester, not applicable or unspecified: Secondary | ICD-10-CM

## 2023-12-23 DIAGNOSIS — O43193 Other malformation of placenta, third trimester: Secondary | ICD-10-CM | POA: Diagnosis not present

## 2023-12-23 DIAGNOSIS — Z3480 Encounter for supervision of other normal pregnancy, unspecified trimester: Secondary | ICD-10-CM

## 2023-12-23 DIAGNOSIS — A630 Anogenital (venereal) warts: Secondary | ICD-10-CM

## 2023-12-23 DIAGNOSIS — O43199 Other malformation of placenta, unspecified trimester: Secondary | ICD-10-CM

## 2023-12-23 NOTE — Progress Notes (Signed)
   Patient information  Patient Name: Morgan Haas  Patient MRN:   [id number]  Referring practice: [facility] Referring Provider: [facility]  Problem List   Patient Active Problem List   Diagnosis Date Noted   Marginal insertion of umbilical cord affecting management of mother 11/29/2023   LGSIL on Pap smear of cervix 10/04/2023   Maternal condyloma acuminatum affecting pregnancy, antepartum 10/04/2023   Supervision of other normal pregnancy, antepartum 07/29/2023   Eczema 11/01/2018   Maternal Fetal medicine Consult  Morgan Haas is a 23 y.o. G1P0000 at [redacted]w[redacted]d here for ultrasound and consultation. [name] is doing well today with no acute concerns. Today we focused on the following:   The patient returned for a growth scan due to low normal fetal growth.  The percentiles were less than the 15 percentile at her anatomy scan.  Today the estimated fetal weight is stable at the 11th percentile.  Neither the patient nor her husband are large people and this is likely reflective of constitutional growth.  She will return in 4 weeks to confirm the fetal growth pattern.  Sonographic findings Single intrauterine pregnancy at 32w 1d.  Fetal cardiac activity:  Observed and appears normal. Presentation: Cephalic. Interval fetal anatomy appears normal. Fetal biometry shows the estimated fetal weight at the 11 percentile. Amniotic fluid volume: Within normal limits. MVP: 4.6 cm. Placenta: Anterior.  There are limitations of prenatal ultrasound such as the inability to detect certain abnormalities due to poor visualization. Various factors such as fetal position, gestational age and maternal body habitus may increase the difficulty in visualizing the fetal anatomy.    Recommendations -Serial growth ultrasounds every 4 weeks until delivery -Delivery per typical OB indications  Review of Systems: A review of systems was performed and was negative except per HPI   Vitals and Physical  Exam    12/23/2023    3:12 PM 11/29/2023    8:30 AM 11/24/2023   12:51 PM  Vitals with BMI  Weight  125 lbs   Systolic 108 117 893  Diastolic 50 69 59  Pulse 84 73 80    Sitting comfortably on the sonogram table Nonlabored breathing Normal rate and rhythm Abdomen is nontender  Past pregnancies OB History  Gravida Para Term Preterm AB Living  1 0 0 0 0 0  SAB IAB Ectopic Multiple Live Births  0 0 0 0 0    # Outcome Date GA Lbr Len/2nd Weight Sex Type Anes PTL Lv  1 Current              I spent 20 minutes reviewing the patients chart, including labs and images as well as counseling the patient about her medical conditions. Greater than 50% of the time was spent in direct face-to-face patient counseling.  Delora Smaller  MFM, [facility]   12/23/2023  3:44 PM

## 2023-12-27 ENCOUNTER — Encounter: Admitting: Physician Assistant

## 2023-12-27 NOTE — Progress Notes (Deleted)
   PRENATAL VISIT NOTE  Subjective:  Morgan Haas is a 23 y.o. G1P0000 at [redacted]w[redacted]d being seen today for ongoing prenatal care.  She is currently monitored for the following issues for this {Blank single:19197::high-risk,low-risk} pregnancy and has Eczema; Supervision of other normal pregnancy, antepartum; LGSIL on Pap smear of cervix; Maternal condyloma acuminatum affecting pregnancy, antepartum; and Marginal insertion of umbilical cord affecting management of mother on their problem list.  Patient reports {sx:14538}.   .  .   . Denies leaking of fluid.   The following portions of the patient's history were reviewed and updated as appropriate: allergies, current medications, past family history, past medical history, past social history, past surgical history and problem list.   Objective:    There were no vitals filed for this visit.  Fetal Status:           General: Alert, oriented and cooperative. Patient is in no acute distress.  Skin: Skin is warm and dry. No rash noted.   Cardiovascular: Normal heart rate noted  Respiratory: Normal respiratory effort, no problems with respiration noted  Abdomen: Soft, gravid, appropriate for gestational age.        Pelvic: Cervical exam deferred        Extremities: Normal range of motion.     Mental Status: Normal mood and affect. Normal behavior. Normal judgment and thought content.   Assessment and Plan:  Pregnancy: G1P0000 at [redacted]w[redacted]d  1. Supervision of other normal pregnancy, antepartum (Primary) Patient doing well, feeling regular fetal movement  BP, FHR, FH appropriate   2. [redacted] weeks gestation of pregnancy Anticipatory guidance about next visits/weeks of pregnancy given.   3. Marginal insertion of umbilical cord affecting management of mother 12/23/23 EFW 1670 gm (11 %)   Preterm labor symptoms and general obstetric precautions including but not limited to vaginal bleeding, contractions, leaking of fluid and fetal movement were reviewed  in detail with the patient. Please refer to After Visit Summary for other counseling recommendations.   No follow-ups on file.  Future Appointments  Date Time Provider Department Center  12/27/2023  9:15 AM Miyanna Wiersma E, PA-C [facility] None  12/29/2023  2:15 PM Sherrine Marcelyn [place], [place] [facility] None  01/17/2024  8:55 AM Milly Olam LABOR, CNM [facility] None  01/20/2024 11:15 AM [facility] PROVIDER 1 [facility]  01/20/2024 11:30 AM [facility] US6 [facility]    Jorene FORBES Moats, PA-C

## 2023-12-29 ENCOUNTER — Ambulatory Visit: Admitting: Licensed Clinical Social Worker

## 2023-12-29 DIAGNOSIS — F4322 Adjustment disorder with anxiety: Secondary | ICD-10-CM | POA: Diagnosis not present

## 2023-12-29 NOTE — BH Specialist Note (Signed)
 Integrated Behavioral Health via Telemedicine Visit  12/29/2023 Morgan Haas [id number]  Number of Integrated Behavioral Health Clinician visits: 6-Sixth Visit  Session Start time: 1420   Session End time: 1448  Total time in minutes: 28    Referring Provider: Dr. Zina Patient/Family location: At [name] Provider location: Remote Office All persons participating in visit: Patient and Morgan Haas Types of Service: Individual psychotherapy and Video visit  I connected with Morgan Haas and/or Morgan Haas's patient via  Telephone or Engineer, civil (consulting)  (Video is Surveyor, mining) and verified that I am speaking with the correct person using two identifiers. Discussed confidentiality: Yes   I discussed the limitations of telemedicine and the availability of in person appointments.  Discussed there is a possibility of technology failure and discussed alternative modes of communication if that failure occurs.  I discussed that engaging in this telemedicine visit, they consent to the provision of behavioral healthcare and the services will be billed under their insurance.  Patient and/or legal guardian expressed understanding and consented to Telemedicine visit: Yes   Presenting Concerns: Patient and/or family reports the following symptoms/concerns: Ongoing improvements with mood.  Duration of problem: Months; Severity of problem: moderate  Patient and/or Family's Strengths/Protective Factors: Social and Emotional competence, Concrete supports in place (healthy food, safe environments, etc.), Physical Health (exercise, healthy diet, medication compliance, etc.), and Caregiver has knowledge of parenting & child development  Goals Addressed: Patient will:  Reduce symptoms of: anxiety and depression   Increase knowledge and/or ability of: coping skills, healthy habits, and self-management skills   Demonstrate ability to: Increase healthy adjustment to current life  circumstances and Increase adequate support systems for patient/family  Progress towards Goals: Ongoing    Interventions: Interventions utilized:  Solution-Focused Strategies, Supportive Counseling, and Supportive Reflection/ Standardized Assessments completed: Not Needed    Patient and/or Family Response:The patient attended her scheduled virtual session. She processed recent changes in her home environment, reporting that she temporarily relocated to her aunt's residence for one week due to her grandmother being diagnosed with shingles. Per her grandmother's physician, the condition was considered highly contagious. The patient noted that during this time, her mother was very supportive--visiting daily and preparing meals for her.The patient also expressed frustration regarding a malfunctioning oven, which took maintenance approximately one month to replace. Although unable to bake, she reported being able to continue preparing meals on the stovetop. The patient collaborated with the [facility] to identify strategies for increasing protein, iron, and calcium in her diet despite these limitations. Additionally, the patient reported improved communication and resolution of recent conflict with her child's father, stating that their interactions have been positive and cooperative.   Clinical Assessment/Diagnosis  Adjustment disorder with anxious mood    Assessment: The patient is adjusting to recent disruptions in her home environment, including a temporary relocation and appliance issues, while continuing to prioritize her health and nutrition. She is also experiencing improved communication and conflict resolution with her child's father, which has contributed to a more stable support system..   Patient may benefit from continued support of integrated behavioral health services.  Plan: Follow up with behavioral health clinician on : 01/12/24 Behavioral recommendations: Continue engaging in  therapy to support emotional regulation and adaptability during environmental stressors. Patient is encouraged to maintain focus on balanced nutrition and healthy communication patterns within her support network. Referral(s): Integrated [employer] (In Clinic)  I discussed the assessment and treatment plan with the patient and/or parent/guardian. They were provided an  opportunity to ask questions and all were answered. They agreed with the plan and demonstrated an understanding of the instructions.   They were advised to call back or seek an in-person evaluation if the symptoms worsen or if the condition fails to improve as anticipated.  Morgan Haas, LCSWA

## 2024-01-11 ENCOUNTER — Other Ambulatory Visit: Payer: Self-pay

## 2024-01-11 ENCOUNTER — Encounter (HOSPITAL_COMMUNITY): Payer: Self-pay | Admitting: Obstetrics and Gynecology

## 2024-01-11 ENCOUNTER — Inpatient Hospital Stay (HOSPITAL_COMMUNITY)
Admission: EM | Admit: 2024-01-11 | Discharge: 2024-01-11 | Disposition: A | Discharge status: Home / Self Care | Attending: Obstetrics and Gynecology | Admitting: Obstetrics and Gynecology

## 2024-01-11 ENCOUNTER — Emergency Department (HOSPITAL_COMMUNITY)

## 2024-01-11 ENCOUNTER — Inpatient Hospital Stay (HOSPITAL_BASED_OUTPATIENT_CLINIC_OR_DEPARTMENT_OTHER)

## 2024-01-11 DIAGNOSIS — O26893 Other specified pregnancy related conditions, third trimester: Secondary | ICD-10-CM | POA: Diagnosis not present

## 2024-01-11 DIAGNOSIS — O4703 False labor before 37 completed weeks of gestation, third trimester: Secondary | ICD-10-CM | POA: Diagnosis not present

## 2024-01-11 DIAGNOSIS — O9A213 Injury, poisoning and certain other consequences of external causes complicating pregnancy, third trimester: Secondary | ICD-10-CM | POA: Insufficient documentation

## 2024-01-11 DIAGNOSIS — Z3480 Encounter for supervision of other normal pregnancy, unspecified trimester: Secondary | ICD-10-CM

## 2024-01-11 DIAGNOSIS — O43193 Other malformation of placenta, third trimester: Secondary | ICD-10-CM | POA: Diagnosis not present

## 2024-01-11 DIAGNOSIS — Z3A34 34 weeks gestation of pregnancy: Secondary | ICD-10-CM

## 2024-01-11 DIAGNOSIS — T1490XA Injury, unspecified, initial encounter: Secondary | ICD-10-CM | POA: Insufficient documentation

## 2024-01-11 LAB — COMPREHENSIVE METABOLIC PANEL WITH GFR
ALT: 25 U/L (ref 0–44)
AST: 26 U/L (ref 15–41)
Albumin: 2.7 g/dL — ABNORMAL LOW (ref 3.5–5.0)
Alkaline Phosphatase: 148 U/L — ABNORMAL HIGH (ref 38–126)
Anion gap: 11 (ref 5–15)
BUN: <5 mg/dL — ABNORMAL LOW (ref 6–20)
CO2: 20 mmol/L — ABNORMAL LOW (ref 22–32)
Calcium: 8.7 mg/dL — ABNORMAL LOW (ref 8.9–10.3)
Chloride: 102 mmol/L (ref 98–111)
Creatinine, Ser: 0.52 mg/dL (ref 0.44–1.00)
GFR, Estimated: >60 mL/min (ref >60)
Glucose, Bld: 117 mg/dL — ABNORMAL HIGH (ref 70–99)
Potassium: 3.4 mmol/L — ABNORMAL LOW (ref 3.5–5.1)
Sodium: 133 mmol/L — ABNORMAL LOW (ref 135–145)
Total Bilirubin: 0.7 mg/dL (ref 0.0–1.2)
Total Protein: 6.4 g/dL — ABNORMAL LOW (ref 6.5–8.1)

## 2024-01-11 LAB — URINALYSIS, ROUTINE W REFLEX MICROSCOPIC
Bilirubin Urine: NEGATIVE
Glucose, UA: NEGATIVE mg/dL
Hgb urine dipstick: NEGATIVE
Ketones, ur: NEGATIVE mg/dL
Leukocytes,Ua: NEGATIVE
Nitrite: NEGATIVE
Protein, ur: NEGATIVE mg/dL
Specific Gravity, Urine: 1.011 (ref 1.005–1.030)
pH: 7 (ref 5.0–8.0)

## 2024-01-11 LAB — CBC
HCT: 31 % — ABNORMAL LOW (ref 36.0–46.0)
Hemoglobin: 10.5 g/dL — ABNORMAL LOW (ref 12.0–15.0)
MCH: 31.4 pg (ref 26.0–34.0)
MCHC: 33.9 g/dL (ref 30.0–36.0)
MCV: 92.8 fL (ref 80.0–100.0)
Platelets: 352 K/uL (ref 150–400)
RBC: 3.34 MIL/uL — ABNORMAL LOW (ref 3.87–5.11)
RDW: 12.8 % (ref 11.5–15.5)
WBC: 8.8 K/uL (ref 4.0–10.5)
nRBC: 0 % (ref 0.0–0.2)

## 2024-01-11 MED ORDER — ACETAMINOPHEN 325 MG PO TABS: 650.0000 mg | ORAL_TABLET | Freq: Once | ORAL | Status: DC

## 2024-01-11 MED ORDER — LACTATED RINGERS IV BOLUS
1000.0000 mL | Freq: Once | INTRAVENOUS | Status: AC
  Administered 2024-01-11: 1000 mL via INTRAVENOUS
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
## 2024-01-11 NOTE — Progress Notes (Signed)
 PT got up to BR at 2242 and had removed EFM.
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
## 2024-01-11 NOTE — Progress Notes (Signed)
 1915: Report received from Rocky Cobia, RN ([facility]). Orders in place to transfer to MAU for continued monitoring.   1935: Pt medically cleared. MAU called for report.   1940: Pt removed from monitor for transport to MAU.
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
## 2024-01-11 NOTE — Progress Notes (Signed)
 Written and verbal d/c instructions given and pt voiced understanding.
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
## 2024-01-11 NOTE — MAU Note (Signed)
 OK for pt to eat per Dr Lola

## 2024-01-12 ENCOUNTER — Encounter: Admitting: Licensed Clinical Social Worker

## 2024-01-12 LAB — KLEIHAUER-BETKE STAIN
Fetal Cells %: 0 %
Quantitation Fetal Hemoglobin: 0 mL

## 2024-01-17 ENCOUNTER — Ambulatory Visit (INDEPENDENT_AMBULATORY_CARE_PROVIDER_SITE_OTHER): Admitting: Advanced Practice Midwife

## 2024-01-17 ENCOUNTER — Encounter: Payer: Self-pay | Admitting: Advanced Practice Midwife

## 2024-01-17 ENCOUNTER — Other Ambulatory Visit (HOSPITAL_COMMUNITY)
Admission: RE | Admit: 2024-01-17 | Discharge: 2024-01-17 | Disposition: A | Discharge status: Home / Self Care | Source: Ambulatory Visit | Attending: Advanced Practice Midwife | Admitting: Advanced Practice Midwife

## 2024-01-17 VITALS — BP 109/63 | HR 76 | Wt 133.0 lb

## 2024-01-17 DIAGNOSIS — Z3A35 35 weeks gestation of pregnancy: Secondary | ICD-10-CM

## 2024-01-17 DIAGNOSIS — R12 Heartburn: Secondary | ICD-10-CM | POA: Diagnosis not present

## 2024-01-17 DIAGNOSIS — O43199 Other malformation of placenta, unspecified trimester: Secondary | ICD-10-CM | POA: Diagnosis not present

## 2024-01-17 DIAGNOSIS — B081 Molluscum contagiosum: Secondary | ICD-10-CM

## 2024-01-17 DIAGNOSIS — O99613 Diseases of the digestive system complicating pregnancy, third trimester: Secondary | ICD-10-CM

## 2024-01-17 DIAGNOSIS — O43193 Other malformation of placenta, third trimester: Secondary | ICD-10-CM | POA: Diagnosis not present

## 2024-01-17 DIAGNOSIS — O26893 Other specified pregnancy related conditions, third trimester: Secondary | ICD-10-CM | POA: Diagnosis not present

## 2024-01-17 DIAGNOSIS — Z3483 Encounter for supervision of other normal pregnancy, third trimester: Secondary | ICD-10-CM | POA: Insufficient documentation

## 2024-01-17 DIAGNOSIS — Z3480 Encounter for supervision of other normal pregnancy, unspecified trimester: Secondary | ICD-10-CM

## 2024-01-17 MED ORDER — FAMOTIDINE 20 MG PO TABS: 20.0000 mg | ORAL_TABLET | Freq: Two times a day (BID) | ORAL | 2 refills | Status: DC

## 2024-01-17 MED ORDER — IMIQUIMOD 5 % EX CREA: TOPICAL_CREAM | CUTANEOUS | 0 refills | Status: AC

## 2024-01-17 NOTE — Progress Notes (Signed)
   PRENATAL VISIT NOTE  Subjective:  Morgan Haas is a 23 y.o. G1P0000 at [redacted]w[redacted]d being seen today for ongoing prenatal care.  She is currently monitored for the following issues for this low-risk pregnancy and has Eczema; Supervision of other normal pregnancy, antepartum; LGSIL on Pap smear of cervix; Maternal condyloma acuminatum affecting pregnancy, antepartum; and Marginal insertion of umbilical cord affecting management of mother on their problem list.  Patient reports heartburn.  Contractions: Not present. Vag. Bleeding: None.  Movement: Present. Denies leaking of fluid.   The following portions of the patient's history were reviewed and updated as appropriate: allergies, current medications, past family history, past medical history, past social history, past surgical history and problem list.   Objective:    Vitals:   01/17/24 0856  BP: 109/63  Pulse: 76  Weight: 133 lb (60.3 kg)    Fetal Status:  Fetal Heart Rate (bpm): 130   Movement: Present    General: Alert, oriented and cooperative. Patient is in no acute distress.  Skin: Skin is warm and dry. No rash noted.   Cardiovascular: Normal heart rate noted  Respiratory: Normal respiratory effort, no problems with respiration noted  Abdomen: Soft, gravid, appropriate for gestational age.  Pain/Pressure: Absent     Pelvic: Cervical exam deferred        Extremities: Normal range of motion.  Edema: None  Mental Status: Normal mood and affect. Normal behavior. Normal judgment and thought content.   Assessment and Plan:  Pregnancy: G1P0000 at [redacted]w[redacted]d 1. Supervision of other normal pregnancy, antepartum --Anticipatory guidance about next visits/weeks of pregnancy given.   2. [redacted] weeks gestation of pregnancy (Primary)  - Cervicovaginal ancillary only( [place]) - Culture, beta strep (group b only)  3. Marginal insertion of umbilical cord affecting management of mother --growth wnl  4. Heartburn during pregnancy in third  trimester  - famotidine  (PEPCID ) 20 MG tablet; Take 1 tablet (20 mg total) by mouth 2 (two) times daily.  Dispense: 60 tablet; Refill: 2    5. Molluscum contagiosum --Improved but not resolved after TCA treatment earlier in pregnancy.  Plan to repeat TCA today but supplies in office not available. Reviewed options and since pt is 35 weeks, with full development of fetus, will prescribe Aldara  that worked well for pt in past .   Preterm labor symptoms and general obstetric precautions including but not limited to vaginal bleeding, contractions, leaking of fluid and fetal movement were reviewed in detail with the patient. Please refer to After Visit Summary for other counseling recommendations.   No follow-ups on file.  Future Appointments  Date Time Provider Department Center  01/19/2024  2:15 PM Sherrine Marcelyn LITTIE ISRAEL [facility] None  01/20/2024 11:15 AM [facility] PROVIDER 1 [facility]  01/20/2024 11:30 AM [facility] US6 [facility]  01/25/2024 10:15 AM Eveline Lynwood MATSU, MD [facility] None    Olam Boards, CNM

## 2024-01-17 NOTE — Progress Notes (Signed)
 Taking own prenatal vits in gummie form.

## 2024-01-19 ENCOUNTER — Ambulatory Visit: Admitting: Licensed Clinical Social Worker

## 2024-01-19 DIAGNOSIS — F4322 Adjustment disorder with anxiety: Secondary | ICD-10-CM | POA: Diagnosis not present

## 2024-01-19 LAB — CERVICOVAGINAL ANCILLARY ONLY
Chlamydia: NEGATIVE
Comment: NEGATIVE
Comment: NEGATIVE
Comment: NORMAL
Neisseria Gonorrhea: NEGATIVE
Trichomonas: NEGATIVE

## 2024-01-20 ENCOUNTER — Ambulatory Visit (HOSPITAL_BASED_OUTPATIENT_CLINIC_OR_DEPARTMENT_OTHER): Admitting: Obstetrics and Gynecology

## 2024-01-20 ENCOUNTER — Ambulatory Visit: Attending: Neurology

## 2024-01-20 VITALS — BP 110/60 | HR 72

## 2024-01-20 DIAGNOSIS — O9A213 Injury, poisoning and certain other consequences of external causes complicating pregnancy, third trimester: Secondary | ICD-10-CM

## 2024-01-20 DIAGNOSIS — O4703 False labor before 37 completed weeks of gestation, third trimester: Secondary | ICD-10-CM

## 2024-01-20 DIAGNOSIS — Z3A36 36 weeks gestation of pregnancy: Secondary | ICD-10-CM | POA: Diagnosis present

## 2024-01-20 DIAGNOSIS — O43199 Other malformation of placenta, unspecified trimester: Secondary | ICD-10-CM | POA: Diagnosis present

## 2024-01-20 DIAGNOSIS — O43193 Other malformation of placenta, third trimester: Secondary | ICD-10-CM

## 2024-01-20 DIAGNOSIS — O365930 Maternal care for other known or suspected poor fetal growth, third trimester, not applicable or unspecified: Secondary | ICD-10-CM | POA: Diagnosis not present

## 2024-01-20 NOTE — Progress Notes (Signed)
 After review, MFM consult with provider is not indicated for today  Arna Ranks, MD 01/20/2024 6:02 PM  [facility]

## 2024-01-21 LAB — CULTURE, BETA STREP (GROUP B ONLY): Strep Gp B Culture: NEGATIVE

## 2024-01-25 ENCOUNTER — Encounter: Admitting: Obstetrics & Gynecology

## 2024-01-27 NOTE — BH Specialist Note (Signed)
 Integrated Behavioral Health via Telemedicine Visit  01/27/2024 Morgan Haas [id number]  Number of Integrated Behavioral Health Clinician visits: Additional Visit  Session Start time: 1415   Session End time: 1525  Total time in minutes: 70    Referring Provider: Dr. Zina Patient/Family location: At home [name] Provider location: Remote Office All persons participating in visit: Patient and [name] Types of Service: Individual psychotherapy and Video visit  I connected with Morgan Haas and/or Morgan Haas's patient via  Telephone or Engineer, civil (consulting)  (Video is Surveyor, mining) and verified that I am speaking with the correct person using two identifiers. Discussed confidentiality: Yes   I discussed the limitations of telemedicine and the availability of in person appointments.  Discussed there is a possibility of technology failure and discussed alternative modes of communication if that failure occurs.  I discussed that engaging in this telemedicine visit, they consent to the provision of behavioral healthcare and the services will be billed under their insurance.  Patient and/or legal guardian expressed understanding and consented to Telemedicine visit: Yes   Presenting Concerns: Patient and/or family reports the following symptoms/concerns: increased anxiety around postpartum Duration of problem: Months; Severity of problem: moderate  Patient and/or Family's Strengths/Protective Factors: Social and Emotional competence, Concrete supports in place (healthy food, safe environments, etc.), Physical Health (exercise, healthy diet, medication compliance, etc.), and Caregiver has knowledge of parenting & child development  Goals Addressed: Patient will:  Reduce symptoms of: anxiety and depression   Increase knowledge and/or ability of: coping skills, healthy habits, and self-management skills   Demonstrate ability to: Increase healthy adjustment to  current life circumstances and Increase adequate support systems for patient/family  Progress towards Goals: Ongoing    Interventions: Interventions utilized:  Mindfulness or Management consultant, Supportive Counseling, Psychoeducation and/or Health Education, Communication Skills, and Supportive Reflection Standardized Assessments completed: Not Needed    Patient and/or Family Response: The patient was present for today's session and reported that she is generally feeling well but is experiencing increased anxiety as she approaches 40 weeks of pregnancy. She shared that she has been working with her doula in preparation for a natural birth but is now open to the possibility of an epidural after experiencing significant pain during a recent vaginal exam. The patient discussed setting boundaries with her grandmother, who smokes cigarettes, to ensure a smoke-free environment for her newborn. Her grandmother expressed willingness to either quit smoking or smoke only outside, away from the baby. The patient also noted ongoing efforts to improve communication with her child's father and shared that his parents have been supportive and have offered to assist with the baby after birth to provide postpartum relief.  Clinical Assessment/Diagnosis  Adjustment disorder with anxious mood    Assessment: The patient is currently experiencing anticipatory anxiety related to childbirth and navigating family dynamics to ensure a safe and supportive postpartum environment..   Patient may benefit from continued support of integrated behavioral heath services.  Plan: Follow up with behavioral health clinician on : 02/02/2024 Behavioral recommendations:  Behavioral health recommendations include continued emotional preparation for labor, reinforcement of healthy communication and boundary-setting with family members, and use of coping strategies to manage anxiety as her due date approaches. Referral(s):  [facility] [employer] (In Clinic)  I discussed the assessment and treatment plan with the patient and/or parent/guardian. They were provided an opportunity to ask questions and all were answered. They agreed with the plan and demonstrated an understanding of the instructions.   They were  advised to call back or seek an in-person evaluation if the symptoms worsen or if the condition fails to improve as anticipated.  Morgan Haas, LCSWA

## 2024-02-02 ENCOUNTER — Encounter: Admitting: Licensed Clinical Social Worker

## 2024-02-03 ENCOUNTER — Encounter: Payer: Self-pay | Admitting: Student

## 2024-02-03 ENCOUNTER — Ambulatory Visit (INDEPENDENT_AMBULATORY_CARE_PROVIDER_SITE_OTHER): Admitting: Student

## 2024-02-03 VITALS — BP 118/69 | HR 86 | Wt 136.8 lb

## 2024-02-03 DIAGNOSIS — Z3A38 38 weeks gestation of pregnancy: Secondary | ICD-10-CM

## 2024-02-03 DIAGNOSIS — Z3480 Encounter for supervision of other normal pregnancy, unspecified trimester: Secondary | ICD-10-CM

## 2024-02-03 NOTE — Progress Notes (Signed)
   PRENATAL VISIT NOTE  Subjective:  Morgan Haas is a 23 y.o. G1P0000 at [redacted]w[redacted]d being seen today for ongoing prenatal care.  She is currently monitored for the following issues for this low-risk pregnancy and has Eczema; Supervision of other normal pregnancy, antepartum; LGSIL on Pap smear of cervix; Maternal condyloma acuminatum affecting pregnancy, antepartum; and Marginal insertion of umbilical cord affecting management of mother on their problem list.  Patient reports backache.  Contractions: Irritability. Vag. Bleeding: None.  Movement: Increased. Denies leaking of fluid.   The following portions of the patient's history were reviewed and updated as appropriate: allergies, current medications, past family history, past medical history, past social history, past surgical history and problem list.   Objective:    Vitals:   02/03/24 1315  BP: 118/69  Pulse: 86  Weight: 136 lb 12.8 oz (62.1 kg)    Fetal Status:  Fetal Heart Rate (bpm): 138   Movement: Increased    General: Alert, oriented and cooperative. Patient is in no acute distress.  Skin: Skin is warm and dry. No rash noted.   Cardiovascular: Normal heart rate noted  Respiratory: Normal respiratory effort, no problems with respiration noted  Abdomen: Soft, gravid, appropriate for gestational age.  Pain/Pressure: Present     Pelvic: Cervical exam deferred        Extremities: Normal range of motion.  Edema: Trace  Mental Status: Normal mood and affect. Normal behavior. Normal judgment and thought content.   Assessment and Plan:  Pregnancy: G1P0000 at [redacted]w[redacted]d 1. Supervision of other normal pregnancy, antepartum (Primary) - Fetal movement present  2. [redacted] weeks gestation of pregnancy - continue weekly visits until delivery  Term labor symptoms and general obstetric precautions including but not limited to vaginal bleeding, contractions, leaking of fluid and fetal movement were reviewed in detail with the patient. Please refer  to After Visit Summary for other counseling recommendations.   Return in about 1 week (around 02/10/2024) for LOB, IN-PERSON.  No future appointments.  Makalynn Berwanger, NP

## 2024-02-03 NOTE — Progress Notes (Signed)
 Pt states her vagina hurts. Like the baby is coming out.   Hasn't had a BM since yesterday; pt already provided with a safe medication list including meds for constipation.  Pt states everything else is going well.

## 2024-02-14 ENCOUNTER — Encounter: Admitting: Physician Assistant

## 2024-02-23 ENCOUNTER — Encounter: Payer: Self-pay | Admitting: Obstetrics and Gynecology

## 2024-02-23 ENCOUNTER — Ambulatory Visit (INDEPENDENT_AMBULATORY_CARE_PROVIDER_SITE_OTHER): Admitting: Obstetrics and Gynecology

## 2024-02-23 VITALS — BP 117/70 | HR 82 | Wt 138.4 lb

## 2024-02-23 DIAGNOSIS — Z3A41 41 weeks gestation of pregnancy: Secondary | ICD-10-CM

## 2024-02-23 DIAGNOSIS — O480 Post-term pregnancy: Secondary | ICD-10-CM | POA: Diagnosis not present

## 2024-02-23 DIAGNOSIS — Z3480 Encounter for supervision of other normal pregnancy, unspecified trimester: Secondary | ICD-10-CM

## 2024-02-23 DIAGNOSIS — O43199 Other malformation of placenta, unspecified trimester: Secondary | ICD-10-CM | POA: Diagnosis not present

## 2024-02-23 NOTE — Progress Notes (Signed)
 Pt reports No concerns today

## 2024-02-23 NOTE — Progress Notes (Signed)
   PRENATAL VISIT NOTE  Subjective:  Morgan Haas is a 23 y.o. G1P0000 at [redacted]w[redacted]d being seen today for ongoing prenatal care.  She is currently monitored for the following issues for this low-risk pregnancy and has Eczema; Supervision of other normal pregnancy, antepartum; LGSIL on Pap smear of cervix; Maternal condyloma acuminatum affecting pregnancy, antepartum; and Marginal insertion of umbilical cord affecting management of mother on their problem list.  Patient reports no complaints.  Contractions: Irregular. Vag. Bleeding: None.  Movement: Present. Denies leaking of fluid.   The following portions of the patient's history were reviewed and updated as appropriate: allergies, current medications, past family history, past medical history, past social history, past surgical history and problem list.   Objective:    Vitals:   02/23/24 1639  BP: 117/70  Pulse: 82  Weight: 138 lb 6.4 oz (62.8 kg)    Fetal Status:      Movement: Present    General: Alert, oriented and cooperative. Patient is in no acute distress.  Skin: Skin is warm and dry. No rash noted.   Cardiovascular: Normal heart rate noted  Respiratory: Normal respiratory effort, no problems with respiration noted  Abdomen: Soft, gravid, appropriate for gestational age.  Pain/Pressure: Present     Pelvic: Cervical exam deferred        Extremities: Normal range of motion.  Edema: Trace  Mental Status: Normal mood and affect. Normal behavior. Normal judgment and thought content.   Assessment and Plan:  Pregnancy: G1P0000 at [redacted]w[redacted]d 1. Supervision of other normal pregnancy, antepartum (Primary) BP and FHR normal Doing well, feeling regular movement    2. [redacted] weeks gestation of pregnancy Missed her 39 week appt NST today reactive Desires waterbirth, consent in chart  IOL scheduled tomorrow  - Fetal nonstress test; Future  3. Marginal insertion of umbilical cord affecting management of mother 9/18 u/s normal growth     Term labor symptoms and general obstetric precautions including but not limited to vaginal bleeding, contractions, leaking of fluid and fetal movement were reviewed in detail with the patient. Please refer to After Visit Summary for other counseling recommendations.     Nidia Daring, FNP

## 2024-02-24 ENCOUNTER — Other Ambulatory Visit: Payer: Self-pay

## 2024-02-24 ENCOUNTER — Inpatient Hospital Stay (HOSPITAL_COMMUNITY)

## 2024-02-24 ENCOUNTER — Inpatient Hospital Stay (HOSPITAL_COMMUNITY)
Admission: AD | Admit: 2024-02-24 | Discharge: 2024-02-27 | DRG: 787 | Disposition: A | Discharge status: Home / Self Care | Attending: Obstetrics and Gynecology | Admitting: Obstetrics and Gynecology

## 2024-02-24 ENCOUNTER — Encounter (HOSPITAL_COMMUNITY): Payer: Self-pay | Admitting: Obstetrics & Gynecology

## 2024-02-24 DIAGNOSIS — O480 Post-term pregnancy: Principal | ICD-10-CM | POA: Diagnosis present

## 2024-02-24 DIAGNOSIS — Z3480 Encounter for supervision of other normal pregnancy, unspecified trimester: Principal | ICD-10-CM

## 2024-02-24 DIAGNOSIS — Z3A41 41 weeks gestation of pregnancy: Secondary | ICD-10-CM | POA: Diagnosis not present

## 2024-02-24 DIAGNOSIS — D62 Acute posthemorrhagic anemia: Secondary | ICD-10-CM | POA: Diagnosis not present

## 2024-02-24 DIAGNOSIS — O43193 Other malformation of placenta, third trimester: Secondary | ICD-10-CM | POA: Diagnosis present

## 2024-02-24 DIAGNOSIS — O9962 Diseases of the digestive system complicating childbirth: Secondary | ICD-10-CM | POA: Diagnosis present

## 2024-02-24 DIAGNOSIS — Z87891 Personal history of nicotine dependence: Secondary | ICD-10-CM | POA: Diagnosis not present

## 2024-02-24 DIAGNOSIS — Z5941 Food insecurity: Secondary | ICD-10-CM | POA: Diagnosis not present

## 2024-02-24 DIAGNOSIS — O4423 Partial placenta previa NOS or without hemorrhage, third trimester: Secondary | ICD-10-CM | POA: Diagnosis not present

## 2024-02-24 DIAGNOSIS — Z8249 Family history of ischemic heart disease and other diseases of the circulatory system: Secondary | ICD-10-CM

## 2024-02-24 DIAGNOSIS — K219 Gastro-esophageal reflux disease without esophagitis: Secondary | ICD-10-CM | POA: Diagnosis present

## 2024-02-24 DIAGNOSIS — O9081 Anemia of the puerperium: Secondary | ICD-10-CM | POA: Diagnosis not present

## 2024-02-24 DIAGNOSIS — Z98891 History of uterine scar from previous surgery: Secondary | ICD-10-CM

## 2024-02-24 LAB — CBC
HCT: 29.6 % — ABNORMAL LOW (ref 36.0–46.0)
Hemoglobin: 10.1 g/dL — ABNORMAL LOW (ref 12.0–15.0)
MCH: 30.1 pg (ref 26.0–34.0)
MCHC: 34.1 g/dL (ref 30.0–36.0)
MCV: 88.1 fL (ref 80.0–100.0)
Platelets: 414 K/uL — ABNORMAL HIGH (ref 150–400)
RBC: 3.36 MIL/uL — ABNORMAL LOW (ref 3.87–5.11)
RDW: 13.1 % (ref 11.5–15.5)
WBC: 7.7 K/uL (ref 4.0–10.5)
nRBC: 0 % (ref 0.0–0.2)

## 2024-02-24 LAB — TYPE AND SCREEN
ABO/RH(D): A POS
Antibody Screen: NEGATIVE

## 2024-02-24 LAB — RPR: RPR Ser Ql: NONREACTIVE

## 2024-02-24 MED ORDER — EPHEDRINE 5 MG/ML INJ
10.0000 mg | INTRAVENOUS | Status: DC | PRN
  Filled 2024-02-24: qty 5

## 2024-02-24 MED ORDER — PHENYLEPHRINE 80 MCG/ML (10ML) SYRINGE FOR IV PUSH (FOR BLOOD PRESSURE SUPPORT)
80.0000 ug | PREFILLED_SYRINGE | INTRAVENOUS | Status: DC | PRN
  Administered 2024-02-25: 80 ug via INTRAVENOUS
  Filled 2024-02-24: qty 10

## 2024-02-24 MED ORDER — TERBUTALINE SULFATE 1 MG/ML IJ SOLN: 0.2500 mg | Freq: Once | INTRAMUSCULAR | Status: DC | PRN

## 2024-02-24 MED ORDER — OXYTOCIN BOLUS FROM INFUSION: 333.0000 mL | Freq: Once | INTRAVENOUS | Status: DC

## 2024-02-24 MED ORDER — OXYCODONE-ACETAMINOPHEN 5-325 MG PO TABS: 1.0000 | ORAL_TABLET | ORAL | Status: DC | PRN

## 2024-02-24 MED ORDER — ACETAMINOPHEN 325 MG PO TABS: 650.0000 mg | ORAL_TABLET | ORAL | Status: DC | PRN

## 2024-02-24 MED ORDER — FENTANYL-BUPIVACAINE-NACL 0.5-0.125-0.9 MG/250ML-% EP SOLN
12.0000 mL/h | EPIDURAL | Status: DC | PRN
  Administered 2024-02-25: 12 mL/h via EPIDURAL
  Filled 2024-02-24 (×3): qty 250

## 2024-02-24 MED ORDER — PHENYLEPHRINE 80 MCG/ML (10ML) SYRINGE FOR IV PUSH (FOR BLOOD PRESSURE SUPPORT): 80.0000 ug | PREFILLED_SYRINGE | INTRAVENOUS | Status: DC | PRN

## 2024-02-24 MED ORDER — LACTATED RINGERS IV SOLN
500.0000 mL | Freq: Once | INTRAVENOUS | Status: AC
  Administered 2024-02-25: 500 mL via INTRAVENOUS

## 2024-02-24 MED ORDER — MISOPROSTOL 25 MCG QUARTER TABLET
25.0000 ug | ORAL_TABLET | Freq: Once | ORAL | Status: AC
  Administered 2024-02-24: 25 ug via VAGINAL
  Filled 2024-02-24: qty 1

## 2024-02-24 MED ORDER — OXYTOCIN-SODIUM CHLORIDE 30-0.9 UT/500ML-% IV SOLN
2.5000 [IU]/h | INTRAVENOUS | Status: DC
  Filled 2024-02-24: qty 500

## 2024-02-24 MED ORDER — SOD CITRATE-CITRIC ACID 500-334 MG/5ML PO SOLN
30.0000 mL | ORAL | Status: DC | PRN
  Administered 2024-02-24: 30 mL via ORAL
  Filled 2024-02-24 (×2): qty 30

## 2024-02-24 MED ORDER — LACTATED RINGERS IV SOLN: INTRAVENOUS | Status: DC

## 2024-02-24 MED ORDER — FENTANYL CITRATE (PF) 100 MCG/2ML IJ SOLN
100.0000 ug | INTRAMUSCULAR | Status: DC | PRN
  Administered 2024-02-24 – 2024-02-25 (×5): 100 ug via INTRAVENOUS
  Filled 2024-02-24 (×5): qty 2

## 2024-02-24 MED ORDER — ONDANSETRON HCL 4 MG/2ML IJ SOLN
4.0000 mg | Freq: Four times a day (QID) | INTRAMUSCULAR | Status: DC | PRN
  Administered 2024-02-24 – 2024-02-25 (×2): 4 mg via INTRAVENOUS
  Filled 2024-02-24 (×2): qty 2

## 2024-02-24 MED ORDER — MISOPROSTOL 50MCG HALF TABLET
50.0000 ug | ORAL_TABLET | Freq: Once | ORAL | Status: DC
Start: 2024-02-24 — End: 2024-02-24

## 2024-02-24 MED ORDER — DIPHENHYDRAMINE HCL 50 MG/ML IJ SOLN: 12.5000 mg | INTRAMUSCULAR | Status: DC | PRN

## 2024-02-24 MED ORDER — EPHEDRINE 5 MG/ML INJ: 10.0000 mg | INTRAVENOUS | Status: DC | PRN

## 2024-02-24 MED ORDER — LIDOCAINE HCL (PF) 1 % IJ SOLN: 30.0000 mL | INTRAMUSCULAR | Status: DC | PRN

## 2024-02-24 MED ORDER — LACTATED RINGERS IV SOLN
500.0000 mL | INTRAVENOUS | Status: DC | PRN
  Administered 2024-02-25: 500 mL via INTRAVENOUS

## 2024-02-24 MED ORDER — MISOPROSTOL 25 MCG QUARTER TABLET
25.0000 ug | ORAL_TABLET | Freq: Once | ORAL | Status: AC
  Administered 2024-02-24: 25 ug via ORAL
  Filled 2024-02-24: qty 1

## 2024-02-24 MED ORDER — OXYCODONE-ACETAMINOPHEN 5-325 MG PO TABS: 2.0000 | ORAL_TABLET | ORAL | Status: DC | PRN
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
## 2024-02-25 ENCOUNTER — Encounter (HOSPITAL_COMMUNITY)
Admission: AD | Disposition: A | Discharge status: Home / Self Care | Payer: Self-pay | Source: Home / Self Care | Attending: Obstetrics and Gynecology

## 2024-02-25 ENCOUNTER — Encounter (HOSPITAL_COMMUNITY): Payer: Self-pay | Admitting: Anesthesiology

## 2024-02-25 ENCOUNTER — Inpatient Hospital Stay (HOSPITAL_COMMUNITY): Admitting: Anesthesiology

## 2024-02-25 ENCOUNTER — Encounter (HOSPITAL_COMMUNITY): Payer: Self-pay | Admitting: Obstetrics & Gynecology

## 2024-02-25 DIAGNOSIS — O480 Post-term pregnancy: Secondary | ICD-10-CM

## 2024-02-25 DIAGNOSIS — Z3A41 41 weeks gestation of pregnancy: Secondary | ICD-10-CM

## 2024-02-25 DIAGNOSIS — O4423 Partial placenta previa NOS or without hemorrhage, third trimester: Secondary | ICD-10-CM

## 2024-02-25 DIAGNOSIS — Z98891 History of uterine scar from previous surgery: Secondary | ICD-10-CM

## 2024-02-25 SURGERY — Surgical Case
Anesthesia: Epidural

## 2024-02-25 MED ORDER — FENTANYL CITRATE (PF) 100 MCG/2ML IJ SOLN
25.0000 ug | INTRAMUSCULAR | Status: DC | PRN
  Administered 2024-02-25: 25 ug via INTRAVENOUS

## 2024-02-25 MED ORDER — LIDOCAINE HCL (PF) 1 % IJ SOLN
INTRAMUSCULAR | Status: DC | PRN
  Administered 2024-02-25 (×2): 5 mL via EPIDURAL

## 2024-02-25 MED ORDER — DEXAMETHASONE SOD PHOSPHATE PF 10 MG/ML IJ SOLN
INTRAMUSCULAR | Status: DC | PRN
  Administered 2024-02-25: 10 mg via INTRAVENOUS

## 2024-02-25 MED ORDER — TERBUTALINE SULFATE 1 MG/ML IJ SOLN
0.2500 mg | Freq: Once | INTRAMUSCULAR | Status: AC | PRN
  Administered 2024-02-25: 0.25 mg via SUBCUTANEOUS

## 2024-02-25 MED ORDER — HYDROMORPHONE HCL 1 MG/ML IJ SOLN: 0.2000 mg | INTRAMUSCULAR | Status: DC | PRN

## 2024-02-25 MED ORDER — IBUPROFEN 600 MG PO TABS
600.0000 mg | ORAL_TABLET | Freq: Four times a day (QID) | ORAL | Status: DC
  Administered 2024-02-27 (×3): 600 mg via ORAL
  Filled 2024-02-25 (×4): qty 1

## 2024-02-25 MED ORDER — COCONUT OIL OIL
1.0000 | TOPICAL_OIL | Status: DC | PRN
  Administered 2024-02-27: 1 via TOPICAL

## 2024-02-25 MED ORDER — OXYTOCIN-SODIUM CHLORIDE 30-0.9 UT/500ML-% IV SOLN: 1.0000 m[IU]/min | INTRAVENOUS | Status: DC

## 2024-02-25 MED ORDER — PRENATAL MULTIVITAMIN CH
1.0000 | ORAL_TABLET | Freq: Every day | ORAL | Status: DC
  Administered 2024-02-26 – 2024-02-27 (×2): 1 via ORAL
  Filled 2024-02-25 (×2): qty 1

## 2024-02-25 MED ORDER — DIPHENHYDRAMINE HCL 25 MG PO CAPS: 25.0000 mg | ORAL_CAPSULE | Freq: Four times a day (QID) | ORAL | Status: DC | PRN

## 2024-02-25 MED ORDER — MORPHINE SULFATE (PF) 0.5 MG/ML IJ SOLN
INTRAMUSCULAR | Status: AC
  Filled 2024-02-25: qty 10

## 2024-02-25 MED ORDER — DEXMEDETOMIDINE HCL IN NACL 80 MCG/20ML IV SOLN
INTRAVENOUS | Status: DC | PRN
  Administered 2024-02-25 (×2): 8 ug via INTRAVENOUS

## 2024-02-25 MED ORDER — MORPHINE SULFATE (PF) 0.5 MG/ML IJ SOLN
INTRAMUSCULAR | Status: DC | PRN
  Administered 2024-02-25: 3 mg via EPIDURAL

## 2024-02-25 MED ORDER — FAMOTIDINE 20 MG PO TABS
20.0000 mg | ORAL_TABLET | Freq: Once | ORAL | Status: DC
Start: 2024-02-25 — End: 2024-02-28

## 2024-02-25 MED ORDER — ACETAMINOPHEN 10 MG/ML IV SOLN
INTRAVENOUS | Status: DC | PRN
  Administered 2024-02-25: 1000 mg via INTRAVENOUS

## 2024-02-25 MED ORDER — WITCH HAZEL-GLYCERIN EX PADS: 1.0000 | MEDICATED_PAD | CUTANEOUS | Status: DC | PRN

## 2024-02-25 MED ORDER — OXYTOCIN-SODIUM CHLORIDE 30-0.9 UT/500ML-% IV SOLN
INTRAVENOUS | Status: DC | PRN
  Administered 2024-02-25: 300 mL via INTRAVENOUS

## 2024-02-25 MED ORDER — MENTHOL 3 MG MT LOZG: 1.0000 | LOZENGE | OROMUCOSAL | Status: DC | PRN

## 2024-02-25 MED ORDER — SIMETHICONE 80 MG PO CHEW: 80.0000 mg | CHEWABLE_TABLET | ORAL | Status: DC | PRN

## 2024-02-25 MED ORDER — SODIUM CHLORIDE 0.9 % IV SOLN
2.0000 g | INTRAVENOUS | Status: DC
  Filled 2024-02-25: qty 2

## 2024-02-25 MED ORDER — KETOROLAC TROMETHAMINE 30 MG/ML IJ SOLN
30.0000 mg | Freq: Four times a day (QID) | INTRAMUSCULAR | Status: AC
  Administered 2024-02-26 (×2): 30 mg via INTRAVENOUS
  Filled 2024-02-25 (×3): qty 1

## 2024-02-25 MED ORDER — KETOROLAC TROMETHAMINE 30 MG/ML IJ SOLN
30.0000 mg | Freq: Four times a day (QID) | INTRAMUSCULAR | Status: DC | PRN
  Administered 2024-02-25: 30 mg via INTRAVENOUS

## 2024-02-25 MED ORDER — OXYTOCIN-SODIUM CHLORIDE 30-0.9 UT/500ML-% IV SOLN
2.5000 [IU]/h | INTRAVENOUS | Status: AC
  Administered 2024-02-25: 2.5 [IU]/h via INTRAVENOUS

## 2024-02-25 MED ORDER — SIMETHICONE 80 MG PO CHEW
80.0000 mg | CHEWABLE_TABLET | Freq: Three times a day (TID) | ORAL | Status: DC
  Administered 2024-02-26 – 2024-02-27 (×5): 80 mg via ORAL
  Filled 2024-02-25 (×6): qty 1

## 2024-02-25 MED ORDER — KETOROLAC TROMETHAMINE 30 MG/ML IJ SOLN
INTRAMUSCULAR | Status: AC
  Filled 2024-02-25: qty 1

## 2024-02-25 MED ORDER — ONDANSETRON HCL 4 MG/2ML IJ SOLN
INTRAMUSCULAR | Status: DC | PRN
  Administered 2024-02-25: 4 mg via INTRAVENOUS

## 2024-02-25 MED ORDER — ACETAMINOPHEN 500 MG PO TABS
1000.0000 mg | ORAL_TABLET | Freq: Four times a day (QID) | ORAL | Status: DC
  Administered 2024-02-26 – 2024-02-27 (×7): 1000 mg via ORAL
  Filled 2024-02-25 (×7): qty 2

## 2024-02-25 MED ORDER — SODIUM CHLORIDE 0.9 % IR SOLN
Status: DC | PRN
Start: 2024-02-25 — End: 2024-02-25
  Administered 2024-02-25: 1

## 2024-02-25 MED ORDER — CEFAZOLIN SODIUM-DEXTROSE 2-3 GM-%(50ML) IV SOLR
INTRAVENOUS | Status: DC | PRN
  Administered 2024-02-25: 2 g via INTRAVENOUS

## 2024-02-25 MED ORDER — TRANEXAMIC ACID-NACL 1000-0.7 MG/100ML-% IV SOLN
INTRAVENOUS | Status: DC | PRN
  Administered 2024-02-25: 1000 mg via INTRAVENOUS

## 2024-02-25 MED ORDER — SODIUM CHLORIDE 0.9 % IV SOLN
2.0000 g | Freq: Two times a day (BID) | INTRAVENOUS | Status: AC
  Administered 2024-02-26 (×2): 2 g via INTRAVENOUS
  Filled 2024-02-25 (×2): qty 2

## 2024-02-25 MED ORDER — DIBUCAINE (PERIANAL) 1 % EX OINT: 1.0000 | TOPICAL_OINTMENT | CUTANEOUS | Status: DC | PRN

## 2024-02-25 MED ORDER — LIDOCAINE-EPINEPHRINE (PF) 2 %-1:200000 IJ SOLN
INTRAMUSCULAR | Status: DC | PRN
  Administered 2024-02-25: 8 mL via EPIDURAL
  Administered 2024-02-25: 2 mL via EPIDURAL

## 2024-02-25 MED ORDER — SENNOSIDES-DOCUSATE SODIUM 8.6-50 MG PO TABS
2.0000 | ORAL_TABLET | Freq: Every day | ORAL | Status: DC
  Administered 2024-02-26 – 2024-02-27 (×2): 2 via ORAL
  Filled 2024-02-25 (×3): qty 2

## 2024-02-25 MED ORDER — FENTANYL CITRATE (PF) 100 MCG/2ML IJ SOLN
INTRAMUSCULAR | Status: AC
  Filled 2024-02-25: qty 2

## 2024-02-25 MED ORDER — OXYCODONE HCL 5 MG PO TABS
5.0000 mg | ORAL_TABLET | ORAL | Status: DC | PRN
  Administered 2024-02-27: 5 mg via ORAL
  Filled 2024-02-25: qty 1

## 2024-02-25 MED ORDER — ZOLPIDEM TARTRATE 5 MG PO TABS: 5.0000 mg | ORAL_TABLET | Freq: Every evening | ORAL | Status: DC | PRN

## 2024-02-25 MED ORDER — AZITHROMYCIN 500 MG IV SOLR
INTRAVENOUS | Status: DC | PRN
  Administered 2024-02-25: 500 mg via INTRAVENOUS

## 2024-02-25 MED ORDER — MEPERIDINE HCL 25 MG/ML IJ SOLN: 6.2500 mg | INTRAMUSCULAR | Status: DC | PRN

## 2024-02-25 MED ORDER — LACTATED RINGERS IV SOLN: INTRAVENOUS | Status: DC | PRN

## 2024-02-25 MED ORDER — KETOROLAC TROMETHAMINE 30 MG/ML IJ SOLN: 30.0000 mg | Freq: Four times a day (QID) | INTRAMUSCULAR | Status: DC | PRN

## 2024-02-25 SURGICAL SUPPLY — 34 items
BENZOIN TINCTURE PRP APPL 2/3 (GAUZE/BANDAGES/DRESSINGS) IMPLANT
CHLORAPREP W/TINT 26 (MISCELLANEOUS) ×2 IMPLANT
CLAMP UMBILICAL CORD (MISCELLANEOUS) ×1 IMPLANT
CLOTH BEACON ORANGE TIMEOUT ST (SAFETY) ×1 IMPLANT
DERMABOND ADVANCED .7 DNX12 (GAUZE/BANDAGES/DRESSINGS) ×2 IMPLANT
DRSG OPSITE POSTOP 4X10 (GAUZE/BANDAGES/DRESSINGS) ×1 IMPLANT
ELECTRODE REM PT RTRN 9FT ADLT (ELECTROSURGICAL) ×1 IMPLANT
EXTRACTOR VACUUM KIWI (MISCELLANEOUS) ×1 IMPLANT
GAUZE PAD ABD 7.5X8 STRL (GAUZE/BANDAGES/DRESSINGS) IMPLANT
GAUZE SPONGE 4X4 12PLY STRL LF (GAUZE/BANDAGES/DRESSINGS) IMPLANT
GLOVE BIOGEL PI IND STRL 7.0 (GLOVE) ×2 IMPLANT
GLOVE ECLIPSE 7.0 STRL STRAW (GLOVE) ×1 IMPLANT
GLOVE SURG SS PI 7.0 STRL IVOR (GLOVE) ×1 IMPLANT
GOWN STRL REUS W/TWL LRG LVL3 (GOWN DISPOSABLE) ×3 IMPLANT
KIT ABG SYR 3ML LUER SLIP (SYRINGE) IMPLANT
NDL HYPO 25X5/8 SAFETYGLIDE (NEEDLE) IMPLANT
NEEDLE HYPO 25X5/8 SAFETYGLIDE (NEEDLE) IMPLANT
NS IRRIG 1000ML POUR BTL (IV SOLUTION) ×1 IMPLANT
PACK C SECTION WH (CUSTOM PROCEDURE TRAY) ×1 IMPLANT
PAD OB MATERNITY 4.3X12.25 (PERSONAL CARE ITEMS) ×1 IMPLANT
RTRCTR C-SECT PINK 25CM LRG (MISCELLANEOUS) ×1 IMPLANT
STRIP CLOSURE SKIN 1/2X4 (GAUZE/BANDAGES/DRESSINGS) IMPLANT
SUT MNCRL 0 VIOLET CTX 36 (SUTURE) ×2 IMPLANT
SUT MNCRL AB 3-0 PS2 27 (SUTURE) ×1 IMPLANT
SUT MNCRL AB 4-0 PS2 18 (SUTURE) ×1 IMPLANT
SUT VIC AB 0 CT1 27XBRD ANBCTR (SUTURE) ×1 IMPLANT
SUT VIC AB 0 CTX36XBRD ANBCTRL (SUTURE) ×1 IMPLANT
SUT VIC AB 2-0 CT1 TAPERPNT 27 (SUTURE) ×1 IMPLANT
SUT VIC AB 2-0 CTX 36 (SUTURE) IMPLANT
SUT VIC AB 4-0 PS2 27 (SUTURE) ×1 IMPLANT
TAPE CLOTH SURG 4X10 WHT LF (GAUZE/BANDAGES/DRESSINGS) IMPLANT
TOWEL OR 17X24 6PK STRL BLUE (TOWEL DISPOSABLE) ×1 IMPLANT
TRAY FOLEY W/BAG SLVR 14FR LF (SET/KITS/TRAYS/PACK) ×1 IMPLANT
WATER STERILE IRR 1000ML POUR (IV SOLUTION) ×1 IMPLANT

## 2024-02-25 NOTE — Progress Notes (Signed)
 Morgan Haas is a 23 y.o. G1P1001 at [redacted]w[redacted]d.  Subjective: Comfortable with epidural per RN.  Objective: BP 120/77   Pulse 89   Temp 100.1 F (37.8 C) (Axillary)   Resp 16   Ht 5' 1 (1.549 m)   Wt 62.6 kg   LMP 05/12/2023 (Approximate)   SpO2 99%   Breastfeeding Unknown   BMI 26.06 kg/m    FHT:  FHR: 130 bpm, variability: min-mod,  accelerations:  15x15,  decelerations:  variables UC:   Q 1-5 minutes, strong VE deferred  Labs: No results found for this or any previous visit (from the past 24 hours).  Assessment / Plan: [redacted]w[redacted]d week IUP Labor: Contraction have spaced out. Will start pitocin. Fetal Wellbeing:  Category I-II, overall reassuring  Pain Control:  Epidural  Anticipated MOD: SVD  Jeshawn Melucci , CNM 02/25/2024 12:30 PM

## 2024-02-25 NOTE — Anesthesia Preprocedure Evaluation (Signed)
 Anesthesia Evaluation  Patient identified by MRN, date of birth, ID band Patient awake    Reviewed: Allergy & Precautions, Patient's Chart, lab work & pertinent test results  Airway Mallampati: II       Dental no notable dental hx.    Pulmonary former smoker   Pulmonary exam normal        Cardiovascular negative cardio ROS Normal cardiovascular exam Rhythm:Regular     Neuro/Psych negative neurological ROS  negative psych ROS   GI/Hepatic Neg liver ROS,GERD  Medicated,,  Endo/Other  negative endocrine ROS    Renal/GU negative Renal ROS  negative genitourinary   Musculoskeletal negative musculoskeletal ROS (+)    Abdominal   Peds  Hematology  (+) Blood dyscrasia, anemia   Anesthesia Other Findings   Reproductive/Obstetrics (+) Pregnancy                              Anesthesia Physical Anesthesia Plan  ASA: 2  Anesthesia Plan: Epidural   Post-op Pain Management:    Induction:   PONV Risk Score and Plan: Treatment may vary due to age or medical condition  Airway Management Planned: Natural Airway  Additional Equipment:   Intra-op Plan:   Post-operative Plan:   Informed Consent: I have reviewed the patients History and Physical, chart, labs and discussed the procedure including the risks, benefits and alternatives for the proposed anesthesia with the patient or authorized representative who has indicated his/her understanding and acceptance.       Plan Discussed with: Anesthesiologist  Anesthesia Plan Comments:          Anesthesia Quick Evaluation

## 2024-02-25 NOTE — Anesthesia Postprocedure Evaluation (Signed)
 Anesthesia Post Note  Patient: Morgan Haas  Procedure(s) Performed: CESAREAN DELIVERY     Patient location during evaluation: PACU Anesthesia Type: Epidural Level of consciousness: awake and alert Pain management: pain level controlled Vital Signs Assessment: post-procedure vital signs reviewed and stable Respiratory status: spontaneous breathing and respiratory function stable Cardiovascular status: blood pressure returned to baseline and stable Postop Assessment: epidural receding Anesthetic complications: no   No notable events documented.  Last Vitals:  Vitals:   02/25/24 2145 02/25/24 2200  BP: 124/71 122/68  Pulse: (!) 102 84  Resp: 20 11  Temp: 37.8 C   SpO2: 97% 97%    Last Pain:  Vitals:   02/25/24 2147  TempSrc:   PainSc: 3    Pain Goal: Patients Stated Pain Goal: 0 (02/25/24 1122)  LLE Motor Response: Purposeful movement (02/25/24 2145) LLE Sensation: Numbness (02/25/24 2145) RLE Motor Response: Purposeful movement (02/25/24 2145) RLE Sensation: Tingling (02/25/24 2145)     Epidural/Spinal Function Cutaneous sensation: Able to Discern Pressure (02/25/24 2145), Patient able to flex knees: No (02/25/24 2145), Patient able to lift hips off bed: No (02/25/24 2145), Back pain beyond tenderness at insertion site: No (02/25/24 2145), Progressively worsening motor and/or sensory loss: No (02/25/24 2145), Bowel and/or bladder incontinence post epidural: No (02/25/24 2145)  Epifanio Lamar BRAVO

## 2024-02-25 NOTE — Anesthesia Procedure Notes (Signed)
 Epidural Patient location during procedure: OB Start time: 02/25/2024 11:53 AM End time: 02/25/2024 12:08 PM  Staffing Anesthesiologist: Jerrye Sharper, MD Performed: anesthesiologist   Preanesthetic Checklist Completed: patient identified, IV checked, site marked, risks and benefits discussed, surgical consent, monitors and equipment checked, pre-op evaluation and timeout performed  Epidural Patient position: sitting Prep: DuraPrep and site prepped and draped Patient monitoring: continuous pulse ox and blood pressure Approach: midline Location: L3-L4 Injection technique: LOR air  Needle:  Needle type: Tuohy  Needle gauge: 17 G Needle length: 9 cm and 9 Needle insertion depth: 5 cm Catheter type: closed end flexible Catheter size: 19 Gauge Catheter at skin depth: 10 cm Test dose: negative and Other  Assessment Events: blood not aspirated, no cerebrospinal fluid, injection not painful, no injection resistance, no paresthesia and negative IV test  Additional Notes Patient identified. Risks and benefits discussed including failed block, incomplete  Pain control, post dural puncture headache, nerve damage, paralysis, blood pressure Changes, nausea, vomiting, reactions to medications-both toxic and allergic and post Partum back pain. All questions were answered. Patient expressed understanding and wished to proceed. Sterile technique was used throughout procedure. Epidural site was Dressed with sterile barrier dressing. No paresthesias, signs of intravascular injection Or signs of intrathecal spread were encountered.  Patient was more comfortable after the epidural was dosed. Please see RN's note for documentation of vital signs and FHR which are stable. Reason for block:procedure for pain

## 2024-02-25 NOTE — Progress Notes (Signed)
 VSS   AROm w/clear fluid around 2300.  Ctx have increased in strength. Now about q 2-4 minuters.  FHR 130, moderate variability, + accels, occ variable.  Cat 1/2.  Unable to come off EFM to get in water dt variables.  Pt in shower.  Was 7/90/-1 per RN check a few hours ago. Declines further exam. Continue expectant mgt.

## 2024-02-25 NOTE — Progress Notes (Signed)
 Morgan Haas is a 23 y.o. G1P0000 at [redacted]w[redacted]d.  Subjective: Coping with comfort measures. Very uncomfortable. Mild pelvic pressure.  Objective: BP 124/63   Pulse 89   Temp 98.8 F (37.1 C) (Axillary)   Resp 16   Ht 5' 1 (1.549 m)   Wt 62.6 kg   LMP 05/12/2023 (Approximate)   BMI 26.06 kg/m    FHT:  FHR: 120  bpm, variability: mod,  accelerations:  15x15,  decelerations:  early decels, few variable decels.  UC:   Q 3-5 minutes, strong Declined VE  Labs: Results for orders placed or performed during the hospital encounter of 02/24/24 (from the past 24 hours)  CBC     Status: Abnormal   Collection Time: 02/24/24 10:21 AM  Result Value Ref Range   WBC 7.7 4.0 - 10.5 K/uL   RBC 3.36 (L) 3.87 - 5.11 MIL/uL   Hemoglobin 10.1 (L) 12.0 - 15.0 g/dL   HCT 70.3 (L) 63.9 - 53.9 %   MCV 88.1 80.0 - 100.0 fL   MCH 30.1 26.0 - 34.0 pg   MCHC 34.1 30.0 - 36.0 g/dL   RDW 86.8 88.4 - 84.4 %   Platelets 414 (H) 150 - 400 K/uL   nRBC 0.0 0.0 - 0.2 %  Type and screen     Status: None   Collection Time: 02/24/24 10:21 AM  Result Value Ref Range   ABO/RH(D) A POS    Antibody Screen NEG    Sample Expiration      02/27/2024,2359 Performed at [facility], [address], [place], [place] [address]   RPR     Status: None   Collection Time: 02/24/24 10:21 AM  Result Value Ref Range   RPR Ser Ql NON REACTIVE NON REACTIVE    Assessment / Plan: [redacted]w[redacted]d week IUP ROM x Hours: 11 Minutes: 6 Labor: Transition. Progressing normally Fetal Wellbeing:  Category I-II, overall reassuring but not candidate for water immersion.  Pain Control: Comfort measures Anticipated MOD: SVD  Modine Oppenheimer , CNM 02/25/2024 9:10 AM

## 2024-02-25 NOTE — Transfer of Care (Signed)
 Immediate Anesthesia Transfer of Care Note  Patient: Morgan Haas  Procedure(s) Performed: CESAREAN DELIVERY  Patient Location: PACU  Anesthesia Type:Epidural  Level of Consciousness: awake, alert , and oriented  Airway & Oxygen Therapy: Patient Spontanous Breathing  Post-op Assessment: Report given to RN and Post -op Vital signs reviewed and stable  Post vital signs: Reviewed and stable  Last Vitals:  Vitals Value Taken Time  BP 126/63 02/25/24 21:16  Temp 38.2 C 02/25/24 21:15  Pulse 101 02/25/24 21:24  Resp 17 02/25/24 21:24  SpO2 100 % 02/25/24 21:24  Vitals shown include unfiled device data.  Last Pain:  Vitals:   02/25/24 2115  TempSrc: Axillary  PainSc:       Patients Stated Pain Goal: 0 (02/25/24 1122)  Complications: No notable events documented.

## 2024-02-25 NOTE — Progress Notes (Signed)
 Morgan Haas is a 23 y.o. G1P1001 at [redacted]w[redacted]d.  Subjective: Not coping well  Objective: BP 120/77   Pulse 89   Temp 100.1 F (37.8 C) (Axillary)   Resp 16   Ht 5' 1 (1.549 m)   Wt 62.6 kg   LMP 05/12/2023 (Approximate)   SpO2 99%   Breastfeeding Unknown   BMI 26.06 kg/m    FHT:  FHR: 125 bpm, variability: mod,  accelerations:  mod,  decelerations:  none UC:   Q 3-6 minutes, strong VE deferred  Labs: No results found for this or any previous visit (from the past 24 hours).  Assessment / Plan: [redacted]w[redacted]d week IUP Labor: Not reassessed Fetal Wellbeing:  Category I Pain Control:  Requesting epidural Anticipated MOD:  SVD  Madisin Hasan , CNM 02/25/2024 11:35 AM

## 2024-02-25 NOTE — Progress Notes (Signed)
 Labor Progress Note  In to see patient to discuss plan of care.  SCE has been unchanged for 4+ hours; she had previously been recommended pitocin augmentation but declined until her support person (mother) returned. Her mom returned around 4 hours later and she is now amenable to pitocin.  We discussed recommendation for CS for active labor arrest as she is 6+ hours unchanged, but given reassuring maternal and fetal status it is reasonable to try a short trial of pitocin. Ultimately decided on trial of pitocin x 2 hours and if still no change, plan for cesarean delivery. Pt is in agreement.   Shortly after I left, patient had prolonged deceleration to 80s x 8 minutes. She received phenylephrine and terbutaline. Pitocin had not been started. Exam by Dr. Eldonna unchanged and FSE placed. Will allow for 30 minutes then restart pitocin.  Kieth Carolin, MD Obstetrician & Gynecologist, [facility] for [employer], [facility]

## 2024-02-25 NOTE — Op Note (Signed)
 Morgan Haas PROCEDURE DATE: 02/25/2024  PREOPERATIVE DIAGNOSES: Intrauterine pregnancy at [redacted]w[redacted]d weeks gestation; arrest of dilation  POSTOPERATIVE DIAGNOSES: The same  PROCEDURE: Primary Low Transverse Cesarean Section  SURGEON:  Dr. Kieth Carolin  ASSISTANT:  Dr. Barabara Maier  ANESTHESIOLOGY TEAM: Anesthesiologist: Epifanio Charleston, MD; Jerrye Sharper, MD  INDICATIONS: Morgan Haas is a 23 y.o. G1P1001 at [redacted]w[redacted]d here for cesarean section secondary to the indications listed under preoperative diagnoses; please see preoperative note for further details.  The risks of surgery were discussed with the patient including but were not limited to: bleeding which may require transfusion or reoperation; infection which may require antibiotics; injury to bowel, bladder, ureters or other surrounding organs; injury to the fetus; need for additional procedures including hysterectomy in the event of a life-threatening hemorrhage; formation of adhesions; placental abnormalities wth subsequent pregnancies; incisional problems; thromboembolic phenomenon and other postoperative/anesthesia complications.  The patient concurred with the proposed plan, giving informed written consent for the procedure.    FINDINGS:  Viable female infant in cephalic presentation.  Apgars 9 and 10.  Amniotic fluid: clear.  Intact placenta, three vessel cord.  Normal uterus, fallopian tubes and ovaries bilaterally.  ANESTHESIA: epidural INTRAVENOUS FLUIDS: 1000 ml   ESTIMATED BLOOD LOSS: 473 ml URINE OUTPUT:  200 ml SPECIMENS: Placenta sent to L&D  COMPLICATIONS: None immediate  PROCEDURE IN DETAIL:  The patient preoperatively received intravenous antibiotics and had sequential compression devices applied to her lower extremities.  She was then taken to the operating room where the epidural was dosed up to a surgical level and found to be adequate. She was then placed in a dorsal supine position with a leftward tilt, and  prepped and draped in a sterile manner.  A foley catheter was  was already in place.  After an adequate timeout was performed, a Pfannenstiel skin incision was made with scalpel and carried through to the underlying layer of fascia. The fascia was incised in the midline, and this incision was extended bluntly. The rectus muscles were separated in the midline and the peritoneum was entered bluntly. Needed to extend skin incision slightly on right side to create space for delivery.  The Alexis self-retaining retractor was introduced into the abdominal cavity.  Attention was turned to the lower uterine segment where a low transverse hysterotomy was made with a scalpel and extended bilaterally bluntly.  The infant was successfully delivered, the cord was clamped and cut after one minute, and the infant was handed over to the awaiting neonatology team. Uterine massage was then administered, and the placenta delivered intact with a three-vessel cord. The uterus was then cleared of clots and debris and exteriorized. The hysterotomy was closed with 0 Vicryl in a running fashion.    The pelvis was cleared of all clot and debris. Uterus returned to the abdomen. Hemostasis was confirmed on all surfaces.  The retractor was removed.  The peritoneum was closed with a 2-0 Vicryl running stitch. The fascia was then closed using 0 Vicryl in a running fashion.  The subcutaneous layer was irrigated, and was found to be hemostatic and any areas of bleeding were cauterized with the bovie. The skin was closed with a 4-0 vicryl subcuticular stitch. The patient tolerated the procedure well. Sponge, instrument and needle counts were correct x 3.  She was taken to the recovery room in stable condition.   Kieth Carolin, MD Obstetrician & Gynecologist, [facility] for [employer], [facility]

## 2024-02-25 NOTE — Progress Notes (Signed)
 Decision for CS Note  In to see patient to discuss plan of care. Her cervix is unchanged now for nearly 10 hours. We have not been able to augment with pitocin. In light of this, I recommended moving forward with cesarean delivery.   The risks of surgery were discussed with the patient including but were not limited to: bleeding which may require transfusion, reoperation, or additional procedures like hysterectomy in the event of life-threatening hemorrhage; infection which may require antibiotics; injury to bowel, bladder, ureters or other surrounding organs; injury to the fetus; formation of adhesions; placental abnormalities with subsequent pregnancies; incisional problems; thromboembolic phenomenon and other postoperative/anesthesia complications.    The patient concurred with the proposed plan, giving informed written consent for the procedure.   Patient will remain NPO for procedure. Anesthesia and OR aware. Preoperative prophylactic antibiotics and SCDs ordered on call to the OR. To OR when ready.  Kieth Carolin, MD Obstetrician & Gynecologist, [facility] for [employer], [facility]

## 2024-02-25 NOTE — Discharge Summary (Signed)
 Postpartum Discharge Summary       Patient Name: Morgan Haas DOB: Nov 23, 2000 MRN: [id number]  Date of admission: 02/24/2024 Delivery date:02/25/2024 Delivering provider: ERIK KIETH BROCKS Date of discharge: 02/27/2024  Admitting diagnosis: Post-dates pregnancy [O48.0] Intrauterine pregnancy: [redacted]w[redacted]d     Secondary diagnosis:  Principal Problem:   Post-dates pregnancy Active Problems:   S/P cesarean section  Additional problems: none    Discharge diagnosis: Term Pregnancy Delivered                                              Post partum procedures:IV iron Augmentation: AROM, Cytotec, and IP Foley Complications: None  Hospital course: Onset of Labor With Unplanned C/S   23 y.o. yo G1P1001 at [redacted]w[redacted]d was admitted in Active Labor on 02/24/2024. Patient had a labor course significant for AoDil. The patient went for cesarean section due to Arrest of Dilation. Delivery details as follows: Membrane Rupture Time/Date: 10:55 PM,02/24/2024  Delivery Method:C-Section, Low Transverse Operative Delivery:N/A Details of operation can be found in separate operative note.   Patient had a postpartum course complicated by acute blood loss anemia, hgb of 7.6 s/p IV iron.  She is ambulating,tolerating a regular diet, passing flatus, and urinating well.  Patient is discharged home in stable condition 02/27/24.  Newborn Data: Birth date:02/25/2024 Birth time:8:27 PM Gender:Female Living status:Living Apgars:9 ,10  Weight:3230 g  Magnesium Sulfate received: No BMZ received: No Rhophylac:N/A MMR:N/A T-DaP:Given prenatally Flu: N/A RSV Vaccine received: No Transfusion:No  Immunizations received: Immunization History  Administered Date(s) Administered   Influenza-Unspecified 02/14/2020   PFIZER(Purple Top)SARS-COV-2 Vaccination 08/24/2019, 09/18/2019   Tdap 11/29/2023    Physical exam  Vitals:   02/26/24 0955 02/26/24 1426 02/26/24 2045 02/27/24 0558  BP: 125/74 (!) 106/54  110/67 115/68  Pulse: 96 (!) 109 99 85  Resp: 18 18 18 17   Temp: 97.7 F (36.5 C) 98.3 F (36.8 C) 98 F (36.7 C) 98.2 F (36.8 C)  TempSrc: Oral Oral Oral Oral  SpO2: 100% 96%    Weight:      Height:       General: alert, cooperative, and no distress Lochia: appropriate Uterine Fundus: firm Incision: Healing well with no significant drainage DVT Evaluation: No evidence of DVT seen on physical exam.  Labs: Lab Results  Component Value Date   WBC 22.1 (H) 02/26/2024   HGB 7.6 (L) 02/26/2024   HCT 23.1 (L) 02/26/2024   MCV 89.2 02/26/2024   PLT 323 02/26/2024      Latest Ref Rng & Units 01/11/2024    6:33 PM  CMP  Glucose 70 - 99 mg/dL 882   BUN 6 - 20 mg/dL <5   Creatinine 9.55 - 1.00 mg/dL 9.47   Sodium 864 - 854 mmol/L 133   Potassium 3.5 - 5.1 mmol/L 3.4   Chloride 98 - 111 mmol/L 102   CO2 22 - 32 mmol/L 20   Calcium 8.9 - 10.3 mg/dL 8.7   Total Protein 6.5 - 8.1 g/dL 6.4   Total Bilirubin 0.0 - 1.2 mg/dL 0.7   Alkaline Phos 38 - 126 U/L 148   AST 15 - 41 U/L 26   ALT 0 - 44 U/L 25    Edinburgh Score:     No data to display         No data recorded  After visit  meds:  Allergies as of 02/27/2024       Reactions   Bee Venom    Pt is unsure of the reaction        Medication List     TAKE these medications    acetaminophen  500 MG tablet Commonly known as: TYLENOL  Take 2 tablets (1,000 mg total) by mouth every 6 (six) hours.   cetirizine  10 MG tablet Commonly known as: ZYRTEC  Take 1 tablet (10 mg total) by mouth daily as needed for allergies.   Eucrisa  2 % Oint Generic drug: Crisaborole  Apply 1 application topically 2 (two) times daily. Apply to affected areas   famotidine  20 MG tablet Commonly known as: Pepcid  Take 1 tablet (20 mg total) by mouth 2 (two) times daily.   fluticasone  50 MCG/ACT nasal spray Commonly known as: FLONASE  Place 1 spray into both nostrils daily.   ibuprofen  600 MG tablet Commonly known as: ADVIL  Take 1  tablet (600 mg total) by mouth every 6 (six) hours.   imiquimod  5 % cream Commonly known as: Aldara  Apply topically 3 (three) times a week.   oxyCODONE 5 MG immediate release tablet Commonly known as: Oxy IR/ROXICODONE Take 1-2 tablets (5-10 mg total) by mouth every 4 (four) hours as needed for moderate pain (pain score 4-6).   promethazine  25 MG tablet Commonly known as: PHENERGAN  Take 1 tablet (25 mg total) by mouth every 6 (six) hours as needed for nausea or vomiting.   Vitafol  Gummies 3.33-0.333-34.8 MG Chew Chew 1 tablet by mouth daily.         Discharge home in stable condition Infant Feeding: Bottle and Breast Infant Disposition:home with mother Discharge instruction: per After Visit Summary and Postpartum booklet. Activity: Advance as tolerated. Pelvic rest for 6 weeks.  Diet: routine diet Future Appointments:No future appointments.  Follow up Visit:  Follow-up Information     [facility] for [facility] Follow up in 1 week(s).   Specialty: Obstetrics and Gynecology Why: postop check, they will call you with an appointment Contact information: [address] [place]  [address] [phone]               Message sent 02/27/2024   Please schedule this patient for a In person postpartum visit in 6 weeks with the following provider: Erik. Additional Postpartum F/U:Incision check 1 week  Low risk pregnancy complicated by: none Delivery mode:  C-Section, Low Transverse Anticipated Birth Control:  Unsure   02/27/2024 Glenys GORMAN Birk, MD

## 2024-02-26 ENCOUNTER — Inpatient Hospital Stay (HOSPITAL_COMMUNITY): Admitting: Anesthesiology

## 2024-02-26 ENCOUNTER — Encounter (HOSPITAL_COMMUNITY): Payer: Self-pay | Admitting: Obstetrics and Gynecology

## 2024-02-26 LAB — CBC
HCT: 23.1 % — ABNORMAL LOW (ref 36.0–46.0)
Hemoglobin: 7.6 g/dL — ABNORMAL LOW (ref 12.0–15.0)
MCH: 29.3 pg (ref 26.0–34.0)
MCHC: 32.9 g/dL (ref 30.0–36.0)
MCV: 89.2 fL (ref 80.0–100.0)
Platelets: 323 K/uL (ref 150–400)
RBC: 2.59 MIL/uL — ABNORMAL LOW (ref 3.87–5.11)
RDW: 13.5 % (ref 11.5–15.5)
WBC: 22.1 K/uL — ABNORMAL HIGH (ref 4.0–10.5)
nRBC: 0 % (ref 0.0–0.2)

## 2024-02-26 MED ORDER — SODIUM CHLORIDE 0.9 % IV SOLN
500.0000 mg | Freq: Once | INTRAVENOUS | Status: AC
  Administered 2024-02-26: 500 mg via INTRAVENOUS
  Filled 2024-02-26: qty 25

## 2024-02-26 MED ORDER — IRON SUCROSE 500 MG IVPB - SIMPLE MED
500.0000 mg | Freq: Once | INTRAVENOUS | Status: DC
  Filled 2024-02-26: qty 275

## 2024-02-26 NOTE — Anesthesia Postprocedure Evaluation (Signed)
 Anesthesia Post Note  Patient: Morgan Haas  Procedure(s) Performed: AN AD HOC LABOR EPIDURAL     Patient location during evaluation: Mother Baby Anesthesia Type: Epidural Level of consciousness: awake and alert Pain management: pain level controlled Vital Signs Assessment: post-procedure vital signs reviewed and stable Respiratory status: spontaneous breathing, nonlabored ventilation and respiratory function stable Cardiovascular status: stable Postop Assessment: no headache, no backache and epidural receding Anesthetic complications: no   No notable events documented.  Last Vitals:  Vitals:   02/26/24 0557 02/26/24 0955  BP: (!) 106/58 125/74  Pulse: 72 96  Resp: 20 18  Temp: 36.9 C 36.5 C  SpO2: 98% 100%    Last Pain:  Vitals:   02/26/24 0955  TempSrc: Oral  PainSc: 0-No pain   Pain Goal: Patients Stated Pain Goal: 0 (02/25/24 1122)                 Asiah Browder

## 2024-02-26 NOTE — Lactation Note (Addendum)
 This note was copied from a baby's chart. Lactation Consultation Note  Patient Name: Morgan Haas Date: 02/26/2024 Age:22 hours, see MOB: MR-C/S delivery.   MOB requested LC services but informed LC she has not had any sleep, really tired and infant will not stop crying when laying him down she has been doing a lot of skin to skin with infant. MOB has support person who was currently holding infant. LC suggested this is good time to get rest. MOB given infant formula prior to [facility] entering the room. She does want to breastfeed and would like help latching infant on her right breast, infant currently will only latch to her left breast. LC suggested she get proper rest but remember infant's tummy size is small so infant will frequently eat every 3 hours or sooner by cues. [facility] written her name on the white board and ask MOB to call when ready for [facility] services. MOB would like two hours of no interruptions to sleep.    Maternal Data    Feeding    LATCH Score                    Lactation Tools Discussed/Used    Interventions    Discharge    Consult Status      Morgan Haas 02/26/2024, 8:26 AM

## 2024-02-26 NOTE — Progress Notes (Signed)
 POSTPARTUM PROGRESS NOTE  POD #1  Subjective:  NATHALIE CAVENDISH is a 23 y.o. G1P1001 s/p pLTCS at [redacted]w[redacted]d. Today she notes she is doing well. She denies any problems with ambulating, voiding or po intake. Denies nausea or vomiting. She has not yet passed flatus, no BM.  Pain is manageable.  Lochia moderate Denies fever/chills/chest pain/SOB.  no HA,  no blurry vision, no RUQ pain  Objective: Blood pressure 125/74, pulse 96, temperature 97.7 F (36.5 C), temperature source Oral, resp. rate 18, height 5' 1 (1.549 m), weight 62.6 kg, last menstrual period 05/12/2023, SpO2 100%, unknown if currently breastfeeding.  Physical Exam:  General: alert, cooperative and no distress Chest: no respiratory distress Heart: regular rate and rhythm Abdomen: soft, nontender Uterine Fundus: firm, appropriately tender Incision: C/D/I with pressure dressing DVT Evaluation: No calf swelling or tenderness Extremities: no edema Skin: warm, dry  Results for orders placed or performed during the hospital encounter of 02/24/24 (from the past 24 hours)  CBC     Status: Abnormal   Collection Time: 02/26/24  7:13 AM  Result Value Ref Range   WBC 22.1 (H) 4.0 - 10.5 K/uL   RBC 2.59 (L) 3.87 - 5.11 MIL/uL   Hemoglobin 7.6 (L) 12.0 - 15.0 g/dL   HCT 76.8 (L) 63.9 - 53.9 %   MCV 89.2 80.0 - 100.0 fL   MCH 29.3 26.0 - 34.0 pg   MCHC 32.9 30.0 - 36.0 g/dL   RDW 86.4 88.4 - 84.4 %   Platelets 323 150 - 400 K/uL   nRBC 0.0 0.0 - 0.2 %    Assessment/Plan: TANYLAH SCHNOEBELEN is a 23 y.o. G1P1001 s/p pLTCS at [redacted]w[redacted]d POD#1 complicated by: 1) Anemia- acute on chronic -plan for IV iron transfusion -currently asymptomatic  2) Postop -foley to be removed -abdominal binder ordered -encouraged ambulation as tolerated  Contraception: declined contraception, likely NFP Feeding: breastfeeding Plan for baby boy circ  Dispo: Routine postop care as outlined above   LOS: 2 days   Elanda Garmany, DO Faculty Attending,  [facility] [employer] 02/26/2024, 1:26 PM

## 2024-02-26 NOTE — Lactation Note (Signed)
 This note was copied from a baby's chart. Lactation Consultation Note  Patient Name: Morgan Haas Date: 02/26/2024 Age:22 hours Reason for consult: 1st time breastfeeding;Follow-up assessment P1, term female infant. MOB call for latch assistance, MOB latched infant on her left breast using the cradle hold position, infant latched with depth, using pillow support, infant breastfeed for 11 minutes, afterwards infant was given 8 mls of EBM by spoon. MOB plans to latch infant first every feeding, will feed by cues on demand, 8-12 times within 24 hours, skin to skin. MOB knows to call for further latch assistance if needed. MOB will supplement infant with formula at her discretion, her feeding choice is breast and formula feeding. MOB has handout Supplementing with breastfeeding.   Maternal Data Has patient been taught Hand Expression?: Yes Does the patient have breastfeeding experience prior to this delivery?: No  Feeding Mother's Current Feeding Choice: Breast Milk and Formula Nipple Type: Slow - flow  LATCH Score Latch: Grasps breast easily, tongue down, lips flanged, rhythmical sucking.  Audible Swallowing: A few with stimulation  Type of Nipple: Everted at rest and after stimulation  Comfort (Breast/Nipple): Soft / non-tender  Hold (Positioning): Assistance needed to correctly position infant at breast and maintain latch.  LATCH Score: 8   Lactation Tools Discussed/Used    Interventions Interventions: Breast feeding basics reviewed;Assisted with latch;Skin to skin;Breast compression;Adjust position;Support pillows;Position options;Expressed milk;Hand express;Education  Discharge    Consult Status Consult Status: Follow-up Date: 02/27/24 Follow-up type: In-patient    Morgan Haas 02/26/2024, 2:17 PM

## 2024-02-26 NOTE — Lactation Note (Signed)
 This note was copied from a baby's chart. Lactation Consultation Note  Patient Name: Morgan Haas Unijb'd Date: 02/26/2024 Age:22 hours Reason for consult: Initial assessment;Primapara;Term  P1. Attempted to see mom RN told LC mom was awake.  When [facility] introduced self mom told LC that she was getting ready to go to sleep. Baby has BF and mom is very sleepy. Mom encouraged to feed baby 8-12 times/24 hours and with feeding cues. Wake baby every 3 hrs if hasn't cued before then. Asked mom if she has a pump mom told LC yes and what kind. Asked mom to call for [facility] when she is ready to latch baby. Mom stated it will probably in the morning before I'm ready for Lactation. LC encouraged to call when needed.  Maternal Data Has patient been taught Hand Expression?: No Does the patient have breastfeeding experience prior to this delivery?: No  Feeding    LATCH Score Latch: Grasps breast easily, tongue down, lips flanged, rhythmical sucking.  Audible Swallowing: A few with stimulation  Type of Nipple: Everted at rest and after stimulation  Comfort (Breast/Nipple): Soft / non-tender  Hold (Positioning): Full assist, staff holds infant at breast  LATCH Score: 7   Lactation Tools Discussed/Used    Interventions Interventions: [facility] Services brochure  Discharge Discharge Education: Outpatient recommendation Pump: Hands Free (mom cozy and Elvie)  Consult Status Consult Status: Follow-up Date: 02/26/24 Follow-up type: In-patient    Ovila Lepage G 02/26/2024, 12:04 AM

## 2024-02-27 MED ORDER — OXYCODONE HCL 5 MG PO TABS: 5.0000 mg | ORAL_TABLET | ORAL | 0 refills | Status: DC | PRN

## 2024-02-27 MED ORDER — IBUPROFEN 600 MG PO TABS: 600.0000 mg | ORAL_TABLET | Freq: Four times a day (QID) | ORAL | 0 refills | Status: DC
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
## 2024-02-27 NOTE — Lactation Note (Signed)
 This note was copied from a baby's chart. Lactation Consultation Note  Patient Name: Morgan Haas Unijb'd Date: 02/27/2024 Age:22 hours Reason for consult: Follow-up assessment;Primapara;1st time breastfeeding;Infant weight loss (attempted to see mom and she requested to sleep. Early D/C and LC will F/U)   Maternal Data    Feeding Mother's Current Feeding Choice: Breast Milk and Formula     Consult Status Consult Status: Follow-up Date: 02/27/24 Follow-up type: In-patient    [name] 02/27/2024, 10:16 AM

## 2024-02-27 NOTE — Plan of Care (Signed)
  Problem: Education: Goal: Knowledge of General Education information will improve Description: Including pain rating scale, medication(s)/side effects and non-pharmacologic comfort measures Outcome: Completed/Met   Problem: Health Behavior/Discharge Planning: Goal: Ability to manage health-related needs will improve Outcome: Completed/Met   Problem: Clinical Measurements: Goal: Ability to maintain clinical measurements within normal limits will improve Outcome: Completed/Met Goal: Will remain free from infection Outcome: Completed/Met Goal: Diagnostic test results will improve Outcome: Completed/Met Goal: Respiratory complications will improve Outcome: Completed/Met Goal: Cardiovascular complication will be avoided Outcome: Completed/Met   Problem: Activity: Goal: Risk for activity intolerance will decrease Outcome: Completed/Met   Problem: Nutrition: Goal: Adequate nutrition will be maintained Outcome: Completed/Met   Problem: Coping: Goal: Level of anxiety will decrease Outcome: Completed/Met   Problem: Elimination: Goal: Will not experience complications related to bowel motility Outcome: Completed/Met Goal: Will not experience complications related to urinary retention Outcome: Completed/Met   Problem: Pain Managment: Goal: General experience of comfort will improve and/or be controlled Outcome: Completed/Met   Problem: Safety: Goal: Ability to remain free from injury will improve Outcome: Completed/Met   Problem: Skin Integrity: Goal: Risk for impaired skin integrity will decrease Outcome: Completed/Met   Problem: Education: Goal: Knowledge of Childbirth will improve Outcome: Completed/Met Goal: Ability to make informed decisions regarding treatment and plan of care will improve Outcome: Completed/Met Goal: Ability to state and carry out methods to decrease the pain will improve Outcome: Completed/Met Goal: Individualized Educational Video(s) Outcome:  Completed/Met   Problem: Coping: Goal: Ability to verbalize concerns and feelings about labor and delivery will improve Outcome: Completed/Met   Problem: Life Cycle: Goal: Ability to make normal progression through stages of labor will improve Outcome: Completed/Met Goal: Ability to effectively push during vaginal delivery will improve Outcome: Completed/Met   Problem: Role Relationship: Goal: Will demonstrate positive interactions with the child Outcome: Completed/Met   Problem: Safety: Goal: Risk of complications during labor and delivery will decrease Outcome: Completed/Met   Problem: Pain Management: Goal: Relief or control of pain from uterine contractions will improve Outcome: Completed/Met   Problem: Education: Goal: Knowledge of the prescribed therapeutic regimen will improve Outcome: Completed/Met Goal: Understanding of sexual limitations or changes related to disease process or condition will improve Outcome: Completed/Met Goal: Individualized Educational Video(s) Outcome: Completed/Met   Problem: Self-Concept: Goal: Communication of feelings regarding changes in body function or appearance will improve Outcome: Completed/Met   Problem: Skin Integrity: Goal: Demonstration of wound healing without infection will improve Outcome: Completed/Met   Problem: Education: Goal: Knowledge of condition will improve Outcome: Completed/Met Goal: Individualized Educational Video(s) Outcome: Completed/Met Goal: Individualized Newborn Educational Video(s) Outcome: Completed/Met   Problem: Activity: Goal: Will verbalize the importance of balancing activity with adequate rest periods Outcome: Completed/Met Goal: Ability to tolerate increased activity will improve Outcome: Completed/Met   Problem: Coping: Goal: Ability to identify and utilize available resources and services will improve Outcome: Completed/Met   Problem: Life Cycle: Goal: Chance of risk for  complications during the postpartum period will decrease Outcome: Completed/Met   Problem: Role Relationship: Goal: Ability to demonstrate positive interaction with newborn will improve Outcome: Completed/Met   Problem: Skin Integrity: Goal: Demonstration of wound healing without infection will improve Outcome: Completed/Met

## 2024-02-28 ENCOUNTER — Telehealth (HOSPITAL_COMMUNITY): Payer: Self-pay | Admitting: *Deleted

## 2024-02-28 DIAGNOSIS — Z1331 Encounter for screening for depression: Secondary | ICD-10-CM

## 2024-02-28 NOTE — Telephone Encounter (Signed)
 Patient met with [facility]Patient CSW, Sharyne Roulette, on 02/27/2024. Per CSW note, patient requests additional appointments with Melodye Seats, the [facility] provider she saw during pregnancy. Placed order for [facility] referral. Dr. Barbra notified via [facility] order.  Mliss Sieve, RN 02/28/2024 14:06

## 2024-03-02 ENCOUNTER — Encounter: Payer: Self-pay | Admitting: Obstetrics and Gynecology

## 2024-03-02 ENCOUNTER — Ambulatory Visit (INDEPENDENT_AMBULATORY_CARE_PROVIDER_SITE_OTHER)

## 2024-03-02 DIAGNOSIS — Z4889 Encounter for other specified surgical aftercare: Secondary | ICD-10-CM

## 2024-03-02 NOTE — Progress Notes (Signed)
 Subjective:     Morgan Haas is a 23 y.o. female who presents to the clinic 1 weeks status post low uterine, transverse cesarean section. Pt reports incision is covered.      Objective:    BP 132/79   Pulse 98   Wt 127 lb (57.6 kg)   LMP 05/12/2023 (Approximate)   BMI 24.00 kg/m  General:  alert, well appearing, in no apparent distress  Incision:   healing well, no drainage, no erythema, no hernia, no seroma, no swelling, no dehiscence, incision well approximated. Honeycomb dressing removed at today's visit.      Assessment:    Doing well postoperatively. Pt did report she had some issues with her IV at the hospital. Took a look at the site and it appears as though IV had infiltrated. Gave pt instructions and to inform us  if not getting better over the next week.    Plan:    1. Continue any current medications. 2. Wound care discussed. 3. Follow up: PP visit or PRN.    Duwaine LOISE Galla, RN

## 2024-04-06 ENCOUNTER — Ambulatory Visit: Admitting: Obstetrics and Gynecology

## 2024-04-06 ENCOUNTER — Encounter: Payer: Self-pay | Admitting: Obstetrics and Gynecology

## 2024-04-06 ENCOUNTER — Other Ambulatory Visit (HOSPITAL_COMMUNITY)
Admission: RE | Admit: 2024-04-06 | Discharge: 2024-04-06 | Disposition: A | Discharge status: Home / Self Care | Source: Ambulatory Visit | Attending: Obstetrics and Gynecology | Admitting: Obstetrics and Gynecology

## 2024-04-06 VITALS — BP 114/72 | HR 67 | Ht 61.0 in | Wt 117.2 lb

## 2024-04-06 DIAGNOSIS — R87612 Low grade squamous intraepithelial lesion on cytologic smear of cervix (LGSIL): Secondary | ICD-10-CM

## 2024-04-06 DIAGNOSIS — Z23 Encounter for immunization: Secondary | ICD-10-CM | POA: Diagnosis not present

## 2024-04-06 NOTE — Progress Notes (Signed)
 Pt declines any contraception Very anxious about needing to have PAP done today

## 2024-04-06 NOTE — Progress Notes (Signed)
 Post Partum Visit Note  Morgan Haas is a 23 y.o. G8P1001 female who presents for a postpartum visit. She is 6 weeks postpartum following a primary cesarean section.  I have fully reviewed the prenatal and intrapartum course. The delivery was at term.  Anesthesia: epidural. Postpartum course has been uncomplicated. Baby is doing well. Baby is feeding by bottle - Enfamil AR. Bleeding no bleeding. Bowel function is normal. Bladder function is normal. Patient is not sexually active. Contraception method is abstinence. Postpartum depression screening: negative.  Feels like mood is better than she imagined. Would like to meet with [facility].   Upstream - 04/06/24 0909       Contraception Wrap Up   End Method Abstinence    Contraception Counseling Provided No    How was the end contraceptive method provided? N/A         The pregnancy intention screening data noted above was reviewed. Potential methods of contraception were discussed. The patient elected to proceed with Abstinence.   Edinburgh Postnatal Depression Scale - 04/06/24 0921       Edinburgh Postnatal Depression Scale:  In the Past 7 Days   I have been able to laugh and see the funny side of things. 0    I have looked forward with enjoyment to things. 0    I have blamed myself unnecessarily when things went wrong. 1    I have been anxious or worried for no good reason. 2    I have felt scared or panicky for no good reason. 2    Things have been getting on top of me. 1    I have been so unhappy that I have had difficulty sleeping. 0    I have felt sad or miserable. 1    I have been so unhappy that I have been crying. 1    The thought of harming myself has occurred to me. 0    Edinburgh Postnatal Depression Scale Total 8          Health Maintenance Due  Topic Date Due   Meningococcal B Vaccine (1 of 2 - Standard) Never done   Pneumococcal Vaccine (1 of 2 - PCV) Never done   Hepatitis B Vaccines 19-59 Average Risk (1 of 3  - 19+ 3-dose series) Never done   Influenza Vaccine  12/03/2023   COVID-19 Vaccine (3 - 2025-26 season) 01/03/2024   HPV VACCINES (2 - 3-dose series) 05/04/2024   The following portions of the patient's history were reviewed and updated as appropriate: allergies, current medications, past family history, past medical history, past social history, past surgical history, and problem list.  Review of Systems Pertinent items are noted in HPI.  Objective:  BP 114/72 (BP Location: Right Arm, Patient Position: Sitting, Cuff Size: Normal)   Pulse 67   Ht 5' 1 (1.549 m)   Wt 117 lb 3.2 oz (53.2 kg)   LMP 05/12/2023 (Approximate)   Breastfeeding No   BMI 22.14 kg/m    General:  alert, cooperative, and no distress   Breasts:  not indicated  Lungs: Normal effort  Heart:  Normal rate  Abdomen: Soft, non tender non distended   Wound Well healed incision  GU exam: NEFG. One genital wart noted. Cervix visually normal. Performed in presence of chaperone       Assessment:   Normal postpartum exam.  Hx LSIL pap - pap collected today. HPV vaccine # 1 given Follow up with [facility] for behavioral health  support  Plan:   Essential components of care per ACOG recommendations:  1.  Mood and well being: Patient with negative depression screening today. Reviewed local resources for support.  - Patient tobacco use? Yes. Patient desires to quit? Yes.Discussed reduction and cessation  - hx of drug use? No.    2. Infant care and feeding:  -Patient currently breastmilk feeding? No.  -Social determinants of health (SDOH) reviewed in EPIC. No concerns  3. Sexuality, contraception and birth spacing - Patient does not want a pregnancy in the next year.  Desired family size is unsure children.  - Reviewed reproductive life planning. Reviewed contraceptive methods based on pt preferences and effectiveness.  Patient desired Abstinence today.   - Discussed birth spacing of 18 months  4. Sleep and  fatigue -Encouraged family/partner/community support of 4 hrs of uninterrupted sleep to help with mood and fatigue  5. Physical Recovery  - Discussed patients delivery and complications. She describes her labor as mixed. - Patient had a C-section failure to progress. Patient had a no laceration. Perineal healing reviewed. Patient expressed understanding - Patient has urinary incontinence? No. - Patient is safe to resume physical and sexual activity  6.  Health Maintenance - HM due items addressed Yes - Last pap smear  Diagnosis  Date Value Ref Range Status  02/22/2023 - Low grade squamous intraepithelial lesion (LSIL) (A)  Final   Pap smear done at today's visit.  -Breast Cancer screening indicated? No.   7. Chronic Disease/Pregnancy Condition follow up: None  Kieth JAYSON Carolin, MD [facility] [employer], [facility]

## 2024-04-11 LAB — CYTOLOGY - PAP
Comment: NEGATIVE
Comment: NEGATIVE
Comment: NEGATIVE
Diagnosis: NEGATIVE
HPV 16: NEGATIVE
HPV 18 / 45: NEGATIVE
High risk HPV: POSITIVE — AB

## 2024-04-12 ENCOUNTER — Ambulatory Visit: Payer: Self-pay | Admitting: Obstetrics and Gynecology

## 2024-04-12 ENCOUNTER — Encounter: Admitting: Licensed Clinical Social Worker

## 2024-04-17 ENCOUNTER — Ambulatory Visit

## 2024-05-03 ENCOUNTER — Ambulatory Visit (INDEPENDENT_AMBULATORY_CARE_PROVIDER_SITE_OTHER): Payer: Self-pay | Admitting: Licensed Clinical Social Worker

## 2024-05-03 DIAGNOSIS — F4321 Adjustment disorder with depressed mood: Secondary | ICD-10-CM

## 2024-05-05 NOTE — BH Specialist Note (Signed)
 "   Integrated Behavioral Health via Telemedicine Visit  05/05/2024 Morgan Haas [id number]  Number of Integrated Behavioral Health Clinician visits: 1- Initial Visit  Session Start time: 0945   Session End time: 1100  Total time in minutes: 75    Referring Provider:  Patient/Family location: Home [facility] Provider location: Remote Office All persons participating in visit: Patient and [facility] Types of Service: Individual psychotherapy and Video visit  I connected with Morgan Haas and/or Morgan Haas's patient via  Psychologist, Clinical  (Video is Surveyor, mining) and verified that I am speaking with the correct person using two identifiers. Discussed confidentiality: Yes   I discussed the limitations of telemedicine and the availability of in person appointments.  Discussed there is a possibility of technology failure and discussed alternative modes of communication if that failure occurs.  I discussed that engaging in this telemedicine visit, they consent to the provision of behavioral healthcare and the services will be billed under their insurance.  Patient and/or legal guardian expressed understanding and consented to Telemedicine visit: Yes   Presenting Concerns: Patient and/or family reports the following symptoms/concerns: Navigating postpartum adjustment Duration of problem: Months; Severity of problem: moderate  Patient and/or Family's Strengths/Protective Factors: Social connections, Concrete supports in place (healthy food, safe environments, etc.), Caregiver has knowledge of parenting & child development, and Parental Resilience  Goals Addressed: Patient will:  Reduce symptoms of: mood instability   Increase knowledge and/or ability of: coping skills, healthy habits, and self-management skills   Demonstrate ability to: Increase healthy adjustment to current life circumstances and Increase adequate support systems for  patient/family  Progress towards Goals: Ongoing    Interventions: Interventions utilized:  Solution-Focused Strategies, Supportive Counseling, Psychoeducation and/or Health Education, Communication Skills, and Supportive Reflection Standardized Assessments completed: Not Needed    Patient and/or Family Response: The patient was present for todays virtual visit. She reported that her son is now two months old and worked in session to process her labor and delivery experience, which she described as very painful and intense, noting that she felt supported by her doula. The patient reported ongoing adjustment to motherhood and the postpartum period, stating that her mood fluctuates largely due to fatigue from nighttime caregiving and limited support when her mother is unavailable due to work. She reported increased household responsibilities, including cleaning and cooking, in addition to caring for her infant. The patient shared that her sons father remains incarcerated, and while she continues to provide emotional support to him, the relationship can be emotionally challenging. She reported that the fathers family has been supportive and involved in providing care and spending time with the baby. The patient was encouraged to utilize available supports to allow for breaks and self-care.   Clinical Assessment/Diagnosis  Adjustment disorder with depressed mood    Assessment: Patient currently experiencing postpartum adjustment challenges characterized by fatigue, mood variability, and increased role strain as a primary caregiver with limited consistent support. Protective factors include extended family support, insight into her emotional needs, and engagement in treatment.   Patient may benefit from continued support of integrated behavioral health services.  Plan: Follow up with behavioral health clinician on : 05/13/23 Behavioral recommendations: patient to continue utilizing available  family supports to obtain regular breaks and prioritize rest and self-care. Recommend ongoing therapy to support postpartum adjustment, emotional regulation, and coping with relational stressors. Referral(s): Integrated [employer] (In Clinic)  I discussed the assessment and treatment plan with the patient  and/or parent/guardian. They were provided an opportunity to ask questions and all were answered. They agreed with the plan and demonstrated an understanding of the instructions.   They were advised to call back or seek an in-person evaluation if the symptoms worsen or if the condition fails to improve as anticipated.  Morgan Haas Seats, LCSWA "

## 2024-05-12 ENCOUNTER — Ambulatory Visit: Payer: Self-pay | Admitting: Licensed Clinical Social Worker

## 2024-05-12 DIAGNOSIS — F4321 Adjustment disorder with depressed mood: Secondary | ICD-10-CM
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
## 2024-05-22 ENCOUNTER — Ambulatory Visit: Payer: Self-pay | Admitting: Licensed Clinical Social Worker

## 2024-05-22 DIAGNOSIS — F4321 Adjustment disorder with depressed mood: Secondary | ICD-10-CM

## 2024-05-22 NOTE — BH Specialist Note (Signed)
 "   Integrated Behavioral Health via Telemedicine Visit  05/22/2024 Morgan Haas [id number]  Number of Integrated Behavioral Health Clinician visits: 2- Second Visit  Session Start time: 1015   Session End time: 1116  Total time in minutes: 61    Referring Provider:  Patient/Family location: Home  [facility] Provider location: Remote Office All persons participating in visit: Patient and [facility] Types of Service: Individual psychotherapy and Video visit  I connected with Morgan Haas and/or Morgan Haas's patient via  Psychologist, Clinical  (Video is Surveyor, mining) and verified that I am speaking with the correct person using two identifiers. Discussed confidentiality: Yes   I discussed the limitations of telemedicine and the availability of in person appointments.  Discussed there is a possibility of technology failure and discussed alternative modes of communication if that failure occurs.  I discussed that engaging in this telemedicine visit, they consent to the provision of behavioral healthcare and the services will be billed under their insurance.  Patient and/or legal guardian expressed understanding and consented to Telemedicine visit: Yes   Presenting Concerns: Patient and/or family reports the following symptoms/concerns: Improvements with mood and anxiety symptoms. Duration of problem: Months; Severity of problem: moderate  Patient and/or Family's Strengths/Protective Factors: Social and Emotional competence, Concrete supports in place (healthy food, safe environments, etc.), Physical Health (exercise, healthy diet, medication compliance, etc.), and Caregiver has knowledge of parenting & child development  Goals Addressed: Patient will:  Reduce symptoms of: mood instability   Increase knowledge and/or ability of: coping skills, healthy habits, and self-management skills   Demonstrate ability to: Increase healthy adjustment to current  life circumstances and Increase adequate support systems for patient/family  Progress towards Goals: Ongoing    Interventions: Interventions utilized:  Mindfulness or Management Consultant, Supportive Counseling, Psychoeducation and/or Health Education, Communication Skills, and Supportive Reflection Standardized Assessments completed: Not Needed    Patient and/or Family Response: Client was present for todays virtual session. She reported that her infant is currently staying with the childs paternal family, which she identified as beneficial as it allowed her uninterrupted time alone. Client shared that she traveled by train to [place] and is staying at an airport hotel, describing improved sleep as she has been sleeping alone through the night. She reported eating regularly at the hotel and remaining engaged in her schoolwork. Client noted receiving flowers from her boyfriend, which positively impacted her mood, and described this time away as a vacation. She also reported that her grandmother recently moved out of the home, resulting in a calmer and more peaceful environment. Client disclosed that her mother's death date is May 23, 2024, describing the day as emotionally difficult; however, she reported receiving emotional support from her boyfriend, which helped improve her mood. Client expressed feeling proud of maintaining sobriety on the day of her mothers passing.   Clinical Assessment/Diagnosis  Adjustment disorder with depressed mood    Assessment: Patient currently experiencing improved mood and restfulness associated with time away, increased independence, and supportive interpersonal relationships, while also coping with recent grief related to her mothers death. She reports emotional benefit from sobriety and environmental changes that have contributed to a greater sense of peace and stability..   Patient may benefit from continued support of integrated behavioral health  services.  Plan: Follow up with behavioral health clinician on : 06/05/24 Behavioral recommendations: Patient to continue engaging in restorative activities that support emotional regulation, sobriety, and academic functioning, while maintaining healthy boundaries and routines. Continued  use of social supports and implementation of coping strategies to process grief are recommended. Referral(s): Integrated [employer] (In Clinic)  I discussed the assessment and treatment plan with the patient and/or parent/guardian. They were provided an opportunity to ask questions and all were answered. They agreed with the plan and demonstrated an understanding of the instructions.   They were advised to call back or seek an in-person evaluation if the symptoms worsen or if the condition fails to improve as anticipated.  Morgan Haas, LCSWA "

## 2024-06-01 ENCOUNTER — Ambulatory Visit

## 2024-06-01 VITALS — BP 103/63 | HR 65 | Ht 61.0 in | Wt 117.2 lb

## 2024-06-01 DIAGNOSIS — Z23 Encounter for immunization: Secondary | ICD-10-CM

## 2024-06-01 NOTE — Progress Notes (Signed)
 Morgan Haas is here for their 2nd Gardasil injection. Pt denies any issues at this time. Pt tolerated injection well. To follow up in 4 months for next injection.

## 2024-06-05 ENCOUNTER — Encounter: Payer: Self-pay | Admitting: Licensed Clinical Social Worker

## 2024-06-13 ENCOUNTER — Encounter: Admitting: Licensed Clinical Social Worker

## 2024-09-28 ENCOUNTER — Ambulatory Visit: Payer: Self-pay
